# Patient Record
Sex: Female | Born: 1945
Health system: Southern US, Community
[De-identification: ages and names within clinical notes are randomized; demographics above are authoritative.]

## PROBLEM LIST (undated history)

## (undated) DIAGNOSIS — F411 Generalized anxiety disorder: Secondary | ICD-10-CM

## (undated) DIAGNOSIS — L300 Nummular dermatitis: Secondary | ICD-10-CM

## (undated) DIAGNOSIS — M810 Age-related osteoporosis without current pathological fracture: Secondary | ICD-10-CM

## (undated) DIAGNOSIS — E782 Mixed hyperlipidemia: Secondary | ICD-10-CM

## (undated) DIAGNOSIS — R002 Palpitations: Secondary | ICD-10-CM

## (undated) HISTORY — DX: Nummular dermatitis: L30.0

## (undated) HISTORY — DX: Mixed hyperlipidemia: E78.2

## (undated) HISTORY — PX: CATARACT EXTRACTION: SUR2

## (undated) HISTORY — DX: Age-related osteoporosis without current pathological fracture: M81.0

## (undated) HISTORY — DX: Palpitations: R00.2

## (undated) HISTORY — DX: Generalized anxiety disorder: F41.1

---

## 1999-10-16 ENCOUNTER — Encounter: Payer: Self-pay | Admitting: Obstetrics and Gynecology

## 1999-10-16 ENCOUNTER — Encounter: Admission: RE | Admit: 1999-10-16 | Discharge: 1999-10-16 | Payer: Self-pay | Admitting: Obstetrics and Gynecology

## 2000-10-20 ENCOUNTER — Encounter: Admission: RE | Admit: 2000-10-20 | Discharge: 2000-10-20 | Payer: Self-pay | Admitting: Obstetrics and Gynecology

## 2000-10-20 ENCOUNTER — Encounter: Payer: Self-pay | Admitting: Obstetrics and Gynecology

## 2001-10-21 ENCOUNTER — Encounter: Payer: Self-pay | Admitting: Obstetrics and Gynecology

## 2001-10-21 ENCOUNTER — Encounter: Admission: RE | Admit: 2001-10-21 | Discharge: 2001-10-21 | Payer: Self-pay | Admitting: Obstetrics and Gynecology

## 2002-10-23 ENCOUNTER — Encounter: Payer: Self-pay | Admitting: Obstetrics and Gynecology

## 2002-10-23 ENCOUNTER — Encounter: Admission: RE | Admit: 2002-10-23 | Discharge: 2002-10-23 | Payer: Self-pay | Admitting: Obstetrics and Gynecology

## 2003-11-07 ENCOUNTER — Encounter: Admission: RE | Admit: 2003-11-07 | Discharge: 2003-11-07 | Payer: Self-pay | Admitting: Obstetrics and Gynecology

## 2004-12-03 ENCOUNTER — Encounter: Admission: RE | Admit: 2004-12-03 | Discharge: 2004-12-03 | Payer: Self-pay | Admitting: Obstetrics and Gynecology

## 2005-12-07 ENCOUNTER — Encounter: Admission: RE | Admit: 2005-12-07 | Discharge: 2005-12-07 | Payer: Self-pay | Admitting: Obstetrics and Gynecology

## 2006-12-10 ENCOUNTER — Encounter: Admission: RE | Admit: 2006-12-10 | Discharge: 2006-12-10 | Payer: Self-pay | Admitting: Obstetrics and Gynecology

## 2007-12-15 ENCOUNTER — Encounter: Admission: RE | Admit: 2007-12-15 | Discharge: 2007-12-15 | Payer: Self-pay | Admitting: Obstetrics and Gynecology

## 2008-12-17 ENCOUNTER — Encounter: Admission: RE | Admit: 2008-12-17 | Discharge: 2008-12-17 | Payer: Self-pay | Admitting: Obstetrics and Gynecology

## 2009-12-18 ENCOUNTER — Encounter
Admission: RE | Admit: 2009-12-18 | Discharge: 2009-12-18 | Payer: Self-pay | Source: Home / Self Care | Attending: Obstetrics and Gynecology | Admitting: Obstetrics and Gynecology

## 2010-01-26 ENCOUNTER — Encounter: Payer: Self-pay | Admitting: Obstetrics and Gynecology

## 2010-11-17 ENCOUNTER — Other Ambulatory Visit: Payer: Self-pay | Admitting: Obstetrics and Gynecology

## 2010-11-17 DIAGNOSIS — Z1231 Encounter for screening mammogram for malignant neoplasm of breast: Secondary | ICD-10-CM

## 2010-12-22 ENCOUNTER — Ambulatory Visit
Admission: RE | Admit: 2010-12-22 | Discharge: 2010-12-22 | Disposition: A | Discharge status: Home / Self Care | Payer: Medicare Other | Source: Ambulatory Visit | Attending: Obstetrics and Gynecology | Admitting: Obstetrics and Gynecology

## 2010-12-22 DIAGNOSIS — Z1231 Encounter for screening mammogram for malignant neoplasm of breast: Secondary | ICD-10-CM

## 2011-11-19 ENCOUNTER — Other Ambulatory Visit: Payer: Self-pay | Admitting: Obstetrics and Gynecology

## 2011-11-19 DIAGNOSIS — Z1231 Encounter for screening mammogram for malignant neoplasm of breast: Secondary | ICD-10-CM

## 2011-12-23 ENCOUNTER — Ambulatory Visit
Admission: RE | Admit: 2011-12-23 | Discharge: 2011-12-23 | Disposition: A | Discharge status: Home / Self Care | Payer: Medicare Other | Source: Ambulatory Visit | Attending: Obstetrics and Gynecology | Admitting: Obstetrics and Gynecology

## 2011-12-23 DIAGNOSIS — Z1231 Encounter for screening mammogram for malignant neoplasm of breast: Secondary | ICD-10-CM

## 2012-11-21 ENCOUNTER — Other Ambulatory Visit: Payer: Self-pay

## 2012-11-21 DIAGNOSIS — Z1231 Encounter for screening mammogram for malignant neoplasm of breast: Secondary | ICD-10-CM

## 2012-12-23 ENCOUNTER — Ambulatory Visit
Admission: RE | Admit: 2012-12-23 | Discharge: 2012-12-23 | Disposition: A | Discharge status: Home / Self Care | Payer: Medicare Other | Source: Ambulatory Visit

## 2012-12-23 DIAGNOSIS — Z1231 Encounter for screening mammogram for malignant neoplasm of breast: Secondary | ICD-10-CM

## 2013-06-01 LAB — HM COLONOSCOPY

## 2013-11-21 ENCOUNTER — Other Ambulatory Visit: Payer: Self-pay

## 2013-11-21 DIAGNOSIS — Z1231 Encounter for screening mammogram for malignant neoplasm of breast: Secondary | ICD-10-CM

## 2013-11-29 DIAGNOSIS — F32A Depression, unspecified: Secondary | ICD-10-CM | POA: Insufficient documentation

## 2013-12-25 ENCOUNTER — Ambulatory Visit
Admission: RE | Admit: 2013-12-25 | Discharge: 2013-12-25 | Disposition: A | Discharge status: Home / Self Care | Payer: Commercial Managed Care - HMO | Source: Ambulatory Visit

## 2013-12-25 DIAGNOSIS — Z1231 Encounter for screening mammogram for malignant neoplasm of breast: Secondary | ICD-10-CM

## 2014-11-22 ENCOUNTER — Other Ambulatory Visit: Payer: Self-pay

## 2014-11-22 DIAGNOSIS — Z1231 Encounter for screening mammogram for malignant neoplasm of breast: Secondary | ICD-10-CM

## 2014-12-28 ENCOUNTER — Ambulatory Visit
Admission: RE | Admit: 2014-12-28 | Discharge: 2014-12-28 | Disposition: A | Discharge status: Home / Self Care | Payer: Commercial Managed Care - HMO | Source: Ambulatory Visit

## 2014-12-28 DIAGNOSIS — Z1231 Encounter for screening mammogram for malignant neoplasm of breast: Secondary | ICD-10-CM

## 2015-11-26 ENCOUNTER — Other Ambulatory Visit: Payer: Self-pay | Admitting: Family Medicine

## 2015-11-26 DIAGNOSIS — Z1231 Encounter for screening mammogram for malignant neoplasm of breast: Secondary | ICD-10-CM

## 2015-12-31 ENCOUNTER — Ambulatory Visit
Admission: RE | Admit: 2015-12-31 | Discharge: 2015-12-31 | Disposition: A | Discharge status: Home / Self Care | Payer: Commercial Managed Care - HMO | Source: Ambulatory Visit | Attending: Family Medicine | Admitting: Family Medicine

## 2015-12-31 DIAGNOSIS — Z1231 Encounter for screening mammogram for malignant neoplasm of breast: Secondary | ICD-10-CM

## 2016-01-09 DIAGNOSIS — H43393 Other vitreous opacities, bilateral: Secondary | ICD-10-CM | POA: Diagnosis not present

## 2016-01-14 DIAGNOSIS — Z6823 Body mass index (BMI) 23.0-23.9, adult: Secondary | ICD-10-CM | POA: Diagnosis not present

## 2016-01-14 DIAGNOSIS — Z23 Encounter for immunization: Secondary | ICD-10-CM | POA: Diagnosis not present

## 2016-01-14 DIAGNOSIS — R10815 Periumbilic abdominal tenderness: Secondary | ICD-10-CM | POA: Diagnosis not present

## 2016-01-14 DIAGNOSIS — Z0001 Encounter for general adult medical examination with abnormal findings: Secondary | ICD-10-CM | POA: Diagnosis not present

## 2016-03-23 DIAGNOSIS — R1084 Generalized abdominal pain: Secondary | ICD-10-CM | POA: Diagnosis not present

## 2016-03-23 DIAGNOSIS — N951 Menopausal and female climacteric states: Secondary | ICD-10-CM | POA: Diagnosis not present

## 2016-03-23 DIAGNOSIS — R69 Illness, unspecified: Secondary | ICD-10-CM | POA: Diagnosis not present

## 2016-03-23 DIAGNOSIS — E782 Mixed hyperlipidemia: Secondary | ICD-10-CM | POA: Diagnosis not present

## 2016-03-23 DIAGNOSIS — R002 Palpitations: Secondary | ICD-10-CM | POA: Diagnosis not present

## 2016-03-26 ENCOUNTER — Other Ambulatory Visit: Payer: Self-pay | Admitting: Family Medicine

## 2016-03-26 DIAGNOSIS — N959 Unspecified menopausal and perimenopausal disorder: Secondary | ICD-10-CM

## 2016-04-01 ENCOUNTER — Ambulatory Visit
Admission: RE | Admit: 2016-04-01 | Discharge: 2016-04-01 | Disposition: A | Discharge status: Home / Self Care | Payer: Medicare HMO | Source: Ambulatory Visit | Attending: Family Medicine | Admitting: Family Medicine

## 2016-04-01 DIAGNOSIS — N959 Unspecified menopausal and perimenopausal disorder: Secondary | ICD-10-CM

## 2016-04-01 DIAGNOSIS — Z780 Asymptomatic menopausal state: Secondary | ICD-10-CM | POA: Diagnosis not present

## 2016-04-01 DIAGNOSIS — M810 Age-related osteoporosis without current pathological fracture: Secondary | ICD-10-CM | POA: Diagnosis not present

## 2016-04-01 LAB — HM DEXA SCAN

## 2016-04-23 DIAGNOSIS — R140 Abdominal distension (gaseous): Secondary | ICD-10-CM | POA: Diagnosis not present

## 2016-04-23 DIAGNOSIS — Z86010 Personal history of colonic polyps: Secondary | ICD-10-CM | POA: Insufficient documentation

## 2016-04-23 DIAGNOSIS — R1030 Lower abdominal pain, unspecified: Secondary | ICD-10-CM | POA: Insufficient documentation

## 2016-04-29 DIAGNOSIS — R1032 Left lower quadrant pain: Secondary | ICD-10-CM | POA: Diagnosis not present

## 2016-04-29 DIAGNOSIS — R109 Unspecified abdominal pain: Secondary | ICD-10-CM | POA: Diagnosis not present

## 2016-06-25 DIAGNOSIS — E782 Mixed hyperlipidemia: Secondary | ICD-10-CM | POA: Diagnosis not present

## 2016-06-25 DIAGNOSIS — Z800 Family history of malignant neoplasm of digestive organs: Secondary | ICD-10-CM | POA: Diagnosis not present

## 2016-06-25 DIAGNOSIS — M810 Age-related osteoporosis without current pathological fracture: Secondary | ICD-10-CM | POA: Diagnosis not present

## 2016-08-18 DIAGNOSIS — Z0100 Encounter for examination of eyes and vision without abnormal findings: Secondary | ICD-10-CM | POA: Diagnosis not present

## 2016-08-27 DIAGNOSIS — H5203 Hypermetropia, bilateral: Secondary | ICD-10-CM | POA: Diagnosis not present

## 2016-10-05 DIAGNOSIS — Z23 Encounter for immunization: Secondary | ICD-10-CM | POA: Diagnosis not present

## 2016-10-05 DIAGNOSIS — E782 Mixed hyperlipidemia: Secondary | ICD-10-CM | POA: Diagnosis not present

## 2016-10-05 DIAGNOSIS — M810 Age-related osteoporosis without current pathological fracture: Secondary | ICD-10-CM | POA: Diagnosis not present

## 2016-11-24 ENCOUNTER — Other Ambulatory Visit: Payer: Self-pay | Admitting: Family Medicine

## 2016-11-24 DIAGNOSIS — Z139 Encounter for screening, unspecified: Secondary | ICD-10-CM

## 2017-01-01 ENCOUNTER — Ambulatory Visit
Admission: RE | Admit: 2017-01-01 | Discharge: 2017-01-01 | Disposition: A | Discharge status: Home / Self Care | Payer: Medicare HMO | Source: Ambulatory Visit | Attending: Family Medicine | Admitting: Family Medicine

## 2017-01-01 DIAGNOSIS — Z1231 Encounter for screening mammogram for malignant neoplasm of breast: Secondary | ICD-10-CM | POA: Diagnosis not present

## 2017-01-01 DIAGNOSIS — Z139 Encounter for screening, unspecified: Secondary | ICD-10-CM

## 2017-01-20 DIAGNOSIS — J028 Acute pharyngitis due to other specified organisms: Secondary | ICD-10-CM | POA: Diagnosis not present

## 2017-03-01 DIAGNOSIS — M810 Age-related osteoporosis without current pathological fracture: Secondary | ICD-10-CM | POA: Diagnosis not present

## 2017-03-01 DIAGNOSIS — E782 Mixed hyperlipidemia: Secondary | ICD-10-CM | POA: Diagnosis not present

## 2017-06-03 DIAGNOSIS — E782 Mixed hyperlipidemia: Secondary | ICD-10-CM | POA: Diagnosis not present

## 2017-06-03 DIAGNOSIS — M810 Age-related osteoporosis without current pathological fracture: Secondary | ICD-10-CM | POA: Diagnosis not present

## 2017-09-07 DIAGNOSIS — Z23 Encounter for immunization: Secondary | ICD-10-CM | POA: Diagnosis not present

## 2017-09-07 DIAGNOSIS — Z6823 Body mass index (BMI) 23.0-23.9, adult: Secondary | ICD-10-CM | POA: Diagnosis not present

## 2017-09-07 DIAGNOSIS — M810 Age-related osteoporosis without current pathological fracture: Secondary | ICD-10-CM | POA: Diagnosis not present

## 2017-09-07 DIAGNOSIS — E782 Mixed hyperlipidemia: Secondary | ICD-10-CM | POA: Diagnosis not present

## 2017-09-10 DIAGNOSIS — L300 Nummular dermatitis: Secondary | ICD-10-CM | POA: Diagnosis not present

## 2017-09-16 DIAGNOSIS — H25813 Combined forms of age-related cataract, bilateral: Secondary | ICD-10-CM | POA: Diagnosis not present

## 2017-10-04 DIAGNOSIS — H2512 Age-related nuclear cataract, left eye: Secondary | ICD-10-CM | POA: Diagnosis not present

## 2017-10-04 DIAGNOSIS — Z01818 Encounter for other preprocedural examination: Secondary | ICD-10-CM | POA: Diagnosis not present

## 2017-10-04 DIAGNOSIS — H25812 Combined forms of age-related cataract, left eye: Secondary | ICD-10-CM | POA: Diagnosis not present

## 2017-10-04 DIAGNOSIS — H1851 Endothelial corneal dystrophy: Secondary | ICD-10-CM | POA: Diagnosis not present

## 2017-10-26 DIAGNOSIS — Z961 Presence of intraocular lens: Secondary | ICD-10-CM | POA: Diagnosis not present

## 2017-11-08 DIAGNOSIS — H25811 Combined forms of age-related cataract, right eye: Secondary | ICD-10-CM | POA: Diagnosis not present

## 2017-11-08 DIAGNOSIS — H1851 Endothelial corneal dystrophy: Secondary | ICD-10-CM | POA: Diagnosis not present

## 2017-11-08 DIAGNOSIS — H2511 Age-related nuclear cataract, right eye: Secondary | ICD-10-CM | POA: Diagnosis not present

## 2017-11-22 ENCOUNTER — Other Ambulatory Visit: Payer: Self-pay | Admitting: Family Medicine

## 2017-11-22 DIAGNOSIS — Z1231 Encounter for screening mammogram for malignant neoplasm of breast: Secondary | ICD-10-CM

## 2017-12-07 DIAGNOSIS — Z961 Presence of intraocular lens: Secondary | ICD-10-CM | POA: Diagnosis not present

## 2017-12-09 DIAGNOSIS — E782 Mixed hyperlipidemia: Secondary | ICD-10-CM | POA: Diagnosis not present

## 2017-12-09 DIAGNOSIS — Z6823 Body mass index (BMI) 23.0-23.9, adult: Secondary | ICD-10-CM | POA: Diagnosis not present

## 2017-12-09 DIAGNOSIS — Z0000 Encounter for general adult medical examination without abnormal findings: Secondary | ICD-10-CM | POA: Diagnosis not present

## 2018-01-04 ENCOUNTER — Ambulatory Visit
Admission: RE | Admit: 2018-01-04 | Discharge: 2018-01-04 | Disposition: A | Discharge status: Home / Self Care | Payer: Medicare HMO | Source: Ambulatory Visit | Attending: Family Medicine | Admitting: Family Medicine

## 2018-01-04 DIAGNOSIS — Z1231 Encounter for screening mammogram for malignant neoplasm of breast: Secondary | ICD-10-CM

## 2018-01-06 ENCOUNTER — Other Ambulatory Visit: Payer: Self-pay | Admitting: Family Medicine

## 2018-01-06 DIAGNOSIS — R928 Other abnormal and inconclusive findings on diagnostic imaging of breast: Secondary | ICD-10-CM

## 2018-01-10 ENCOUNTER — Other Ambulatory Visit: Payer: Medicare HMO

## 2018-01-12 ENCOUNTER — Ambulatory Visit
Admission: RE | Admit: 2018-01-12 | Discharge: 2018-01-12 | Disposition: A | Discharge status: Home / Self Care | Payer: Medicare HMO | Source: Ambulatory Visit | Attending: Family Medicine | Admitting: Family Medicine

## 2018-01-12 ENCOUNTER — Other Ambulatory Visit: Payer: Self-pay | Admitting: Family Medicine

## 2018-01-12 DIAGNOSIS — R928 Other abnormal and inconclusive findings on diagnostic imaging of breast: Secondary | ICD-10-CM

## 2018-01-12 DIAGNOSIS — N6001 Solitary cyst of right breast: Secondary | ICD-10-CM | POA: Diagnosis not present

## 2018-01-12 DIAGNOSIS — R922 Inconclusive mammogram: Secondary | ICD-10-CM | POA: Diagnosis not present

## 2018-01-12 DIAGNOSIS — N6489 Other specified disorders of breast: Secondary | ICD-10-CM | POA: Diagnosis not present

## 2018-01-12 DIAGNOSIS — N6310 Unspecified lump in the right breast, unspecified quadrant: Secondary | ICD-10-CM

## 2018-02-17 DIAGNOSIS — J018 Other acute sinusitis: Secondary | ICD-10-CM | POA: Diagnosis not present

## 2018-03-09 DIAGNOSIS — K130 Diseases of lips: Secondary | ICD-10-CM | POA: Diagnosis not present

## 2018-03-17 DIAGNOSIS — E782 Mixed hyperlipidemia: Secondary | ICD-10-CM | POA: Diagnosis not present

## 2018-03-17 DIAGNOSIS — M810 Age-related osteoporosis without current pathological fracture: Secondary | ICD-10-CM | POA: Diagnosis not present

## 2018-03-17 DIAGNOSIS — K1239 Other oral mucositis (ulcerative): Secondary | ICD-10-CM | POA: Diagnosis not present

## 2018-03-17 DIAGNOSIS — Z6823 Body mass index (BMI) 23.0-23.9, adult: Secondary | ICD-10-CM | POA: Diagnosis not present

## 2018-03-22 ENCOUNTER — Other Ambulatory Visit: Payer: Self-pay | Admitting: Family Medicine

## 2018-03-22 DIAGNOSIS — M810 Age-related osteoporosis without current pathological fracture: Secondary | ICD-10-CM

## 2018-06-08 DIAGNOSIS — Z800 Family history of malignant neoplasm of digestive organs: Secondary | ICD-10-CM | POA: Diagnosis not present

## 2018-06-08 DIAGNOSIS — K648 Other hemorrhoids: Secondary | ICD-10-CM | POA: Diagnosis not present

## 2018-06-08 DIAGNOSIS — Z86010 Personal history of colonic polyps: Secondary | ICD-10-CM | POA: Diagnosis not present

## 2018-06-08 DIAGNOSIS — D124 Benign neoplasm of descending colon: Secondary | ICD-10-CM | POA: Diagnosis not present

## 2018-06-08 DIAGNOSIS — K5730 Diverticulosis of large intestine without perforation or abscess without bleeding: Secondary | ICD-10-CM | POA: Diagnosis not present

## 2018-06-08 DIAGNOSIS — K635 Polyp of colon: Secondary | ICD-10-CM | POA: Diagnosis not present

## 2018-06-08 DIAGNOSIS — Z1211 Encounter for screening for malignant neoplasm of colon: Secondary | ICD-10-CM | POA: Diagnosis not present

## 2018-06-08 DIAGNOSIS — D120 Benign neoplasm of cecum: Secondary | ICD-10-CM | POA: Diagnosis not present

## 2018-06-08 LAB — HM COLONOSCOPY

## 2018-06-21 DIAGNOSIS — Z6823 Body mass index (BMI) 23.0-23.9, adult: Secondary | ICD-10-CM | POA: Diagnosis not present

## 2018-06-21 DIAGNOSIS — E782 Mixed hyperlipidemia: Secondary | ICD-10-CM | POA: Diagnosis not present

## 2018-06-21 DIAGNOSIS — Z1159 Encounter for screening for other viral diseases: Secondary | ICD-10-CM | POA: Diagnosis not present

## 2018-07-15 ENCOUNTER — Ambulatory Visit
Admission: RE | Admit: 2018-07-15 | Discharge: 2018-07-15 | Disposition: A | Discharge status: Home / Self Care | Payer: Medicare HMO | Source: Ambulatory Visit | Attending: Family Medicine | Admitting: Family Medicine

## 2018-07-15 ENCOUNTER — Other Ambulatory Visit: Payer: Self-pay | Admitting: Family Medicine

## 2018-07-15 ENCOUNTER — Other Ambulatory Visit: Payer: Self-pay

## 2018-07-15 DIAGNOSIS — R922 Inconclusive mammogram: Secondary | ICD-10-CM | POA: Diagnosis not present

## 2018-07-15 DIAGNOSIS — N6313 Unspecified lump in the right breast, lower outer quadrant: Secondary | ICD-10-CM | POA: Diagnosis not present

## 2018-07-15 DIAGNOSIS — N6310 Unspecified lump in the right breast, unspecified quadrant: Secondary | ICD-10-CM

## 2018-07-15 DIAGNOSIS — Z780 Asymptomatic menopausal state: Secondary | ICD-10-CM | POA: Diagnosis not present

## 2018-07-15 DIAGNOSIS — M810 Age-related osteoporosis without current pathological fracture: Secondary | ICD-10-CM | POA: Diagnosis not present

## 2018-07-15 DIAGNOSIS — N6314 Unspecified lump in the right breast, lower inner quadrant: Secondary | ICD-10-CM | POA: Diagnosis not present

## 2018-09-23 DIAGNOSIS — M810 Age-related osteoporosis without current pathological fracture: Secondary | ICD-10-CM | POA: Diagnosis not present

## 2018-09-23 DIAGNOSIS — Z6823 Body mass index (BMI) 23.0-23.9, adult: Secondary | ICD-10-CM | POA: Diagnosis not present

## 2018-09-23 DIAGNOSIS — E782 Mixed hyperlipidemia: Secondary | ICD-10-CM | POA: Diagnosis not present

## 2018-09-23 DIAGNOSIS — Z23 Encounter for immunization: Secondary | ICD-10-CM | POA: Diagnosis not present

## 2018-12-14 DIAGNOSIS — Z0000 Encounter for general adult medical examination without abnormal findings: Secondary | ICD-10-CM | POA: Diagnosis not present

## 2018-12-14 DIAGNOSIS — Z6823 Body mass index (BMI) 23.0-23.9, adult: Secondary | ICD-10-CM | POA: Diagnosis not present

## 2018-12-14 DIAGNOSIS — E782 Mixed hyperlipidemia: Secondary | ICD-10-CM | POA: Diagnosis not present

## 2019-01-20 ENCOUNTER — Other Ambulatory Visit: Payer: Medicare HMO

## 2019-01-24 ENCOUNTER — Other Ambulatory Visit: Payer: Self-pay

## 2019-01-24 ENCOUNTER — Ambulatory Visit
Admission: RE | Admit: 2019-01-24 | Discharge: 2019-01-24 | Disposition: A | Discharge status: Home / Self Care | Payer: Medicare HMO | Source: Ambulatory Visit | Attending: Family Medicine | Admitting: Family Medicine

## 2019-01-24 ENCOUNTER — Other Ambulatory Visit: Payer: Self-pay | Admitting: Family Medicine

## 2019-01-24 DIAGNOSIS — N6314 Unspecified lump in the right breast, lower inner quadrant: Secondary | ICD-10-CM | POA: Diagnosis not present

## 2019-01-24 DIAGNOSIS — R921 Mammographic calcification found on diagnostic imaging of breast: Secondary | ICD-10-CM

## 2019-01-24 DIAGNOSIS — N6313 Unspecified lump in the right breast, lower outer quadrant: Secondary | ICD-10-CM | POA: Diagnosis not present

## 2019-01-24 DIAGNOSIS — N6310 Unspecified lump in the right breast, unspecified quadrant: Secondary | ICD-10-CM

## 2019-01-24 DIAGNOSIS — R922 Inconclusive mammogram: Secondary | ICD-10-CM | POA: Diagnosis not present

## 2019-02-01 ENCOUNTER — Other Ambulatory Visit: Payer: Self-pay

## 2019-02-01 ENCOUNTER — Ambulatory Visit
Admission: RE | Admit: 2019-02-01 | Discharge: 2019-02-01 | Disposition: A | Discharge status: Home / Self Care | Payer: Medicare HMO | Source: Ambulatory Visit | Attending: Family Medicine | Admitting: Family Medicine

## 2019-02-01 DIAGNOSIS — N6489 Other specified disorders of breast: Secondary | ICD-10-CM | POA: Diagnosis not present

## 2019-02-01 DIAGNOSIS — R921 Mammographic calcification found on diagnostic imaging of breast: Secondary | ICD-10-CM

## 2019-02-01 DIAGNOSIS — N6311 Unspecified lump in the right breast, upper outer quadrant: Secondary | ICD-10-CM | POA: Diagnosis not present

## 2019-03-15 ENCOUNTER — Ambulatory Visit (INDEPENDENT_AMBULATORY_CARE_PROVIDER_SITE_OTHER): Payer: Medicare HMO | Admitting: Family Medicine

## 2019-03-15 ENCOUNTER — Other Ambulatory Visit: Payer: Self-pay

## 2019-03-15 ENCOUNTER — Encounter: Payer: Self-pay | Admitting: Family Medicine

## 2019-03-15 VITALS — BP 124/68 | HR 74 | Temp 96.4°F | Ht 60.0 in | Wt 116.0 lb

## 2019-03-15 DIAGNOSIS — M7541 Impingement syndrome of right shoulder: Secondary | ICD-10-CM | POA: Diagnosis not present

## 2019-03-15 DIAGNOSIS — M79605 Pain in left leg: Secondary | ICD-10-CM | POA: Diagnosis not present

## 2019-03-15 MED ORDER — MELOXICAM 15 MG PO TABS: 15.0000 mg | ORAL_TABLET | Freq: Every day | ORAL | 0 refills | Status: DC

## 2019-03-15 NOTE — Progress Notes (Signed)
Established Patient Office Visit  Subjective:  Patient ID: Kimberly Estrada, female    DOB: [birth year]  Age: 74 y.o. MRN: [id number]  CC:  Chief Complaint  Patient presents with  . Shoulder Pain    Decreased ROM has been applying HEMP and [place]    HPI Kimberly Estrada presents for left lower leg and right shoulder pain. Leg pain for 1-2 months and is worse with ambulation. Right shoulder pain worsened when she was reaching into her back seat and had sudden pain in her shoulder.  She is unable to walk daily as she used to do because of pain in her left lower leg.   Past Medical History:  Diagnosis Date  . GAD (generalized anxiety disorder)   . Mixed hyperlipidemia   . Nummular dermatitis   . Osteoporosis   . Palpitations     Past Surgical History:  Procedure Laterality Date  . CATARACT EXTRACTION Bilateral     Family History  Problem Relation Age of Onset  . Breast cancer Mother 64  . CAD Father   . Cancer Sister        colon    Social History   Socioeconomic History  . Marital status: Married    Spouse name: Lettye Bohls  . Number of children: 3  . Years of education: Not on file  . Highest education level: Not on file  Occupational History  . Not on file  Tobacco Use  . Smoking status: Never Smoker  . Smokeless tobacco: Never Used  Substance and Sexual Activity  . Alcohol use: Never  . Drug use: Never  . Sexual activity: Not on file  Other Topics Concern  . Not on file  Social History Narrative  . Not on file   Social Determinants of Health   Financial Resource Strain:   . Difficulty of Paying Living Expenses: Not on file  Food Insecurity:   . Worried About Charity fundraiser in the Last Year: Not on file  . Ran Out of Food in the Last Year: Not on file  Transportation Needs:   . Lack of Transportation (Medical): Not on file  . Lack of Transportation (Non-Medical): Not on file  Physical Activity:   . Days of Exercise per Week: Not on  file  . Minutes of Exercise per Session: Not on file  Stress:   . Feeling of Stress : Not on file  Social Connections:   . Frequency of Communication with Friends and Family: Not on file  . Frequency of Social Gatherings with Friends and Family: Not on file  . Attends Religious Services: Not on file  . Active Member of Clubs or Organizations: Not on file  . Attends Archivist Meetings: Not on file  . Marital Status: Not on file  Intimate Partner Violence:   . Fear of Current or Ex-Partner: Not on file  . Emotionally Abused: Not on file  . Physically Abused: Not on file  . Sexually Abused: Not on file    Outpatient Medications Prior to Visit  Medication Sig Dispense Refill  . co-enzyme Q-10 30 MG capsule Take by mouth.    . Omega-3 1000 MG CAPS Take by mouth.    . pravastatin (PRAVACHOL) 80 MG tablet Take 40 mg by mouth at bedtime.     No facility-administered medications prior to visit.    Not on File  ROS Review of Systems  Constitutional: Negative for chills and fever.  HENT: Negative for  ear pain, sneezing and sore throat.   Respiratory: Negative for cough and shortness of breath.   Cardiovascular: Negative for chest pain.  Gastrointestinal: Negative for abdominal pain, constipation, diarrhea, nausea and vomiting.  Genitourinary: Negative for dysuria.  Musculoskeletal: Negative for arthralgias, joint swelling and myalgias.       Right shoulder pain decreased ROM  Psychiatric/Behavioral: Negative for dysphoric mood. The patient is not nervous/anxious.       Objective:    Physical Exam  Constitutional: She appears well-developed and well-nourished.  Cardiovascular: Normal rate, regular rhythm and intact distal pulses.  Pulses may be slightly decreased on left foot over right foot.   Pulmonary/Chest: Effort normal and breath sounds normal. No respiratory distress.  Abdominal: Soft. Bowel sounds are normal. There is no abdominal tenderness.   Musculoskeletal:        General: Tenderness present.     Comments: With external/internal rotation and abduction.  Left posterior upper calf and knee tender. Full rom of knee. No edema. No varicosities.  Neurological: She is alert.    BP 124/68 (BP Location: Right Arm, Patient Position: Sitting)   Pulse 74   Temp (!) 96.4 F (35.8 C) (Temporal)   Ht 5' (1.524 m)   Wt 116 lb (52.6 kg)   SpO2 98%   BMI 22.65 kg/m  Wt Readings from Last 3 Encounters:  03/15/19 116 lb (52.6 kg)     Health Maintenance Due  Topic Date Due  . Hepatitis C Screening  Jan 17, 1945  . TETANUS/TDAP  10/02/1964  . COLONOSCOPY  10/03/1995  . PNA vac Low Risk Adult (1 of 2 - PCV13) 10/03/2010  . INFLUENZA VACCINE  08/06/2018    Assessment & Plan:  1. Rotator cuff impingement syndrome of right shoulder Meloxicam 15 mg once daily. If no improvement, recommend xray and consider steroid injection. Patient does not wish to proceed with injection at this time.  Exercises/education given.   2. Left leg pain History is inconsistent with PVD. Education given. If pain is not improving with meloxicam, will proceed with ABI.  Follow-up: No follow-ups on file.    Arsenio Katz, CMA

## 2019-03-15 NOTE — Assessment & Plan Note (Signed)
Muscle strain versus claudication. Patient wishes to hold of on ABI. I discussed my concerns and if she is no better we weill proceed with ABI at her last visti.

## 2019-03-15 NOTE — Assessment & Plan Note (Signed)
Meloxicam 15 mg once daily.  Exercises given.

## 2019-03-15 NOTE — Patient Instructions (Signed)
Start meloxicam 15 mg once daily.   Peripheral Vascular Disease  Peripheral vascular disease (PVD) is a disease of the blood vessels that are not part of your heart and brain. A simple term for PVD is poor circulation. In most cases, PVD narrows the blood vessels that carry blood from your heart to the rest of your body. This can reduce the supply of blood to your arms, legs, and internal organs, like your stomach or kidneys. However, PVD most often affects a person's lower legs and feet. Without treatment, PVD tends to get worse. PVD can also lead to acute ischemic limb. This is when an arm or leg suddenly cannot get enough blood. This is a medical emergency. Follow these instructions at home: Lifestyle  Do not use any products that contain nicotine or tobacco, such as cigarettes and e-cigarettes. If you need help quitting, ask your doctor.  Lose weight if you are overweight. Or, stay at a healthy weight as told by your doctor.  Eat a diet that is low in fat and cholesterol. If you need help, ask your doctor.  Exercise regularly. Ask your doctor for activities that are right for you. General instructions  Take over-the-counter and prescription medicines only as told by your doctor.  Take good care of your feet: ? Wear comfortable shoes that fit well. ? Check your feet often for any cuts or sores.  Keep all follow-up visits as told by your doctor This is important. Contact a doctor if:  You have cramps in your legs when you walk.  You have leg pain when you are at rest.  You have coldness in a leg or foot.  Your skin changes.  You are unable to get or have an erection (erectile dysfunction).  You have cuts or sores on your feet that do not heal. Get help right away if:  Your arm or leg turns cold, numb, and blue.  Your arms or legs become red, warm, swollen, painful, or numb.  You have chest pain.  You have trouble breathing.  You suddenly have weakness in your face,  arm, or leg.  You become very confused or you cannot speak.  You suddenly have a very bad headache.  You suddenly cannot see. Summary  Peripheral vascular disease (PVD) is a disease of the blood vessels.  A simple term for PVD is poor circulation. Without treatment, PVD tends to get worse.  Treatment may include exercise, low fat and low cholesterol diet, and quitting smoking. This information is not intended to replace advice given to you by your health care provider. Make sure you discuss any questions you have with your health care provider. Document Revised: 12/04/2016 Document Reviewed: 01/30/2016 Elsevier Patient Education  [place] Cuff Tear  A rotator cuff tear is a partial or complete tear of the cord-like bands (tendons) that connect muscle to bone in the rotator cuff. The rotator cuff is a group of muscles and tendons that surround the shoulder joint and keep the upper arm bone (humerus) in the shoulder socket. The tear can occur suddenly (acute tear) or can develop over a long period of time (chronic tear). What are the causes? Acute tears may be caused by:  A fall, especially on an outstretched arm.  Lifting very heavy objects with a jerking motion. Chronic tears may be caused by overuse of the muscles. This may happen in sports, physical work, or activities in which your arm repeatedly moves over your head. What increases the  risk? This condition is more likely to occur in:  Athletes and workers who frequently use their shoulder or reach over their heads. This may include activities such as: ? Tennis. ? Baseball and softball. ? Swimming and rowing. ? Weightlifting. ? Architect work. ? Painting.  People who smoke.  Older people who have arthritis or poor blood supply. These can make the muscles and tendons weaker. What are the signs or symptoms? Symptoms of this condition depend on the type and severity of the injury:  An acute tear may  include a sudden tearing feeling, followed by severe pain that goes from your upper shoulder, down your arm, and toward your elbow.  A chronic tear includes a gradual weakness and decreased shoulder motion as the pain gets worse. The pain is usually worse at night. Both types may have symptoms such as:  Pain that spreads (radiates) from the shoulder to the upper arm.  Swelling and tenderness in front of the shoulder.  Decreased range of motion.  Pain when: ? Reaching, pulling, or lifting the arm above the head. ? Lowering the arm from above the head.  Not being able to raise your arm out to the side.  Difficulty placing the arm behind your back. How is this diagnosed? This condition is diagnosed with a medical history and physical exam. Imaging tests may also be done, including:  X-rays.  MRI.  Ultrasound.  CT or MR arthrogram. During this test, a contrast material is injected into your shoulder and then images are taken. How is this treated? Treatment for this condition depends on the type and severity of the condition. In less severe cases, treatment may include:  Rest. This may be done with a sling that holds the shoulder still (immobilization). Your health care provider may also recommend avoiding activities that involve lifting your arm over your head.  Icing the shoulder.  Anti-inflammatory medicines, such as aspirin or ibuprofen.  Strengthening and stretching exercises. Your health care provider may recommend specific exercises to improve your range of motion and strengthen your shoulder. In more severe cases, treatment may include:  Physical therapy.  Steroid injections.  Surgery. Follow these instructions at home: Managing pain, stiffness, and swelling  If directed, put ice on the injured area. ? If you have a removable sling, remove it as told by your health care provider. ? Put ice in a plastic bag. ? Place a towel between your skin and the bag. ? Leave  the ice on for 20 minutes, 2-3 times a day.  Raise (elevate) the injured area above the level of your heart while you are lying down.  Find a comfortable sleeping position or sleep on a recliner, if available.  Move your fingers often to avoid stiffness and to lessen swelling.  Once the swelling has gone down, your health care provider may direct you to apply heat to relax the muscles. Use the heat source that your health care provider recommends, such as a moist heat pack or a heating pad. ? Place a towel between your skin and the heat source. ? Leave the heat on for 20-30 minutes. ? Remove the heat if your skin turns bright red. This is especially important if you are unable to feel pain, heat, or cold. You may have a greater risk of getting burned. If you have a sling:  Wear the sling as told by your health care provider. Remove it only as told by your health care provider.  Loosen the sling if your  fingers tingle, become numb, or turn cold and blue.  Keep the sling clean.  If the sling is not waterproof: ? Do not let it get wet. ? Cover it with a watertight covering when you take a bath or a shower. Driving  Do not drive or use heavy machinery while taking prescription pain medicine.  Ask your health care provider when it is safe to drive if you have a sling on your arm. Activity  Rest your shoulder as told by your health care provider.  Return to your normal activities as told by your health care provider. Ask your health care provider what activities are safe for you.  Do any exercises or stretches as told by your health care provider. General instructions  Do not use any products that contain nicotine or tobacco, such as cigarettes and e-cigarettes. If you need help quitting, ask your health care provider.  Take over-the-counter and prescription medicines only as told by your health care provider.  Keep all follow-up visits as told by your health care provider. This is  important. Contact a health care provider if:  Your pain gets worse.  You have new pain in your arm, hands, or fingers.  Medicine does not help your pain. Get help right away if:  Your arm, hand, or fingers are numb or tingling.  Your arm, hand, or fingers are swollen or painful or they turn white or blue.  Your hand or fingers on your injured arm are colder than your other hand. Summary  A rotator cuff tear is a partial or complete tear of the cord-like bands (tendons) that connect muscle to bone in the rotator cuff.  The tear can occur suddenly (acute tear) or can develop over a long period of time (chronic tear).  Treatment generally includes rest, anti-inflammatory medicines, and icing. In some cases, physical therapy and steroid injections may be needed. In severe cases, surgery may be needed. This information is not intended to replace advice given to you by your health care provider. Make sure you discuss any questions you have with your health care provider. Document Revised: 12/04/2016 Document Reviewed: 03/09/2016 Elsevier Patient Education  [place].   Secondary Shoulder Impingement Syndrome Rehab Ask your health care provider which exercises are safe for you. Do exercises exactly as told by your health care provider and adjust them as directed. It is normal to feel mild stretching, pulling, tightness, or discomfort as you do these exercises. Stop right away if you feel sudden pain or your pain gets worse. Do not begin these exercises until told by your health care provider. Stretching and range-of-motion exercise This exercise warms up your muscles and joints and improves the movement and flexibility of your neck and shoulder. This exercise also helps to relieve pain and stiffness. Cervical side bend  1. Using good posture, sit on a stable chair, or stand up. 2. Without moving your shoulders, slowly tilt your left / right ear toward your left / right shoulder  until you feel a stretch in your neck (cervical) muscles on the other side. You should be looking straight ahead. 3. Hold for __________ seconds. 4. Slowly return to the starting position. 5. Repeat the stretch on your left / right side. Repeat __________ times. Complete this exercise __________ times a day. Strengthening exercises These exercises build strength and endurance in your shoulder. Endurance is the ability to use your muscles for a long time, even after they get tired. Scapular protraction, supine  1. Shanda Howells  on your back on a firm surface (supine position). Hold a __________ weight in your left / right hand. 2. Raise your left / right arm straight into the air so your hand is directly above your shoulder joint. 3. Push the weight into the air so your shoulder (scapula) lifts off the surface that you are lying on. The scapula will push up or forward (protraction). Do not move your head, neck, or back. 4. Hold for __________ seconds. 5. Slowly return to the starting position. Let your muscles relax completely before you repeat this exercise. Repeat __________ times. Complete this exercise __________ times a day. Scapular retraction  1. Sit in a stable chair without armrests, or stand up. 2. Secure an exercise band to a stable object in front of you so the band is at shoulder height. 3. Hold one end of the exercise band in each hand. Your palms should face down. 4. Squeeze your shoulder blades (scapulae) together and move your elbows slightly behind you (retraction). Do not shrug your shoulders upward while you do this. 5. Hold for __________ seconds. 6. Slowly return to the starting position. Repeat __________ times. Complete this exercise __________ times a day. Shoulder extension with scapular retraction  1. Sit in a stable chair without armrests, or stand up. 2. Secure an exercise band to a stable object in front of you so the band is above shoulder height. 3. Hold one end of  the exercise band in each hand. 4. Straighten your elbows and lift your hands up to shoulder height. 5. Squeeze your shoulder blades together (scapular retraction) and pull your hands down to the sides of your thighs (shoulder extension). Stop when your hands are straight down by your sides. Do not let your hands go behind your body. 6. Hold for __________ seconds. 7. Slowly return to the starting position. Repeat __________ times. Complete this exercise __________ times a day. Shoulder abduction 1. Sit in a stable chair without armrests, or stand up. 2. If directed, hold a __________ weight in your left / right hand. 3. Start with your arms straight down. Turn your left / right hand so your palm faces in, toward your body. 4. Slowly lift your left / right hand out to your side (abduction). Do not lift your hand above shoulder height. ? Keep your arms straight. ? Avoid shrugging your shoulder while you do this movement. Keep your shoulder blade tucked down toward the middle of your back. 5. Hold for __________ seconds. 6. Slowly lower your arm, and return to the starting position. Repeat __________ times. Complete this exercise __________ times a day. This information is not intended to replace advice given to you by your health care provider. Make sure you discuss any questions you have with your health care provider. Document Revised: 04/15/2018 Document Reviewed: 01/27/2018 Elsevier Patient Education  [place].

## 2019-03-23 ENCOUNTER — Encounter: Payer: Self-pay | Admitting: Family Medicine

## 2019-04-07 ENCOUNTER — Other Ambulatory Visit: Payer: Self-pay | Admitting: Family Medicine

## 2019-04-12 ENCOUNTER — Ambulatory Visit: Payer: Medicare HMO | Admitting: Family Medicine

## 2019-09-20 ENCOUNTER — Ambulatory Visit (INDEPENDENT_AMBULATORY_CARE_PROVIDER_SITE_OTHER): Payer: Medicare HMO | Admitting: Legal Medicine

## 2019-09-20 ENCOUNTER — Encounter: Payer: Self-pay | Admitting: Legal Medicine

## 2019-09-20 ENCOUNTER — Other Ambulatory Visit: Payer: Self-pay

## 2019-09-20 VITALS — BP 118/88 | HR 83 | Temp 97.8°F | Resp 16 | Ht 59.65 in | Wt 117.0 lb

## 2019-09-20 DIAGNOSIS — L247 Irritant contact dermatitis due to plants, except food: Secondary | ICD-10-CM

## 2019-09-20 DIAGNOSIS — T7840XA Allergy, unspecified, initial encounter: Secondary | ICD-10-CM

## 2019-09-20 MED ORDER — TRIAMCINOLONE ACETONIDE 40 MG/ML IJ SUSP
60.0000 mg | Freq: Once | INTRAMUSCULAR | Status: AC
  Administered 2019-09-20: 60 mg via INTRAMUSCULAR

## 2019-09-20 MED ORDER — TRIAMCINOLONE ACETONIDE 0.1 % EX CREA: 1.0000 "application " | TOPICAL_CREAM | Freq: Two times a day (BID) | CUTANEOUS | 2 refills | Status: DC

## 2019-09-20 NOTE — Progress Notes (Signed)
Acute Office Visit  Subjective:    Patient ID: Kimberly Estrada, female    DOB: 1945-09-12, 74 y.o.   MRN: [id number]  Chief Complaint  Patient presents with  . Rash    Since 5 days ago on left arm  . Anal Itching    HPI Patient is in today for rash left antecubital space.she was bit by insect in garden. It is red and confluent.  Few small papules.  Past Medical History:  Diagnosis Date  . GAD (generalized anxiety disorder)   . Mixed hyperlipidemia   . Nummular dermatitis   . Osteoporosis   . Palpitations     Past Surgical History:  Procedure Laterality Date  . CATARACT EXTRACTION Bilateral     Family History  Problem Relation Age of Onset  . Breast cancer Mother 23  . CAD Father   . Cancer Sister        colon    Social History   Socioeconomic History  . Marital status: Married    Spouse name: Berdina Cheever  . Number of children: 3  . Years of education: Not on file  . Highest education level: Not on file  Occupational History  . Not on file  Tobacco Use  . Smoking status: Never Smoker  . Smokeless tobacco: Never Used  Substance and Sexual Activity  . Alcohol use: Never  . Drug use: Never  . Sexual activity: Yes    Partners: Female  Other Topics Concern  . Not on file  Social History Narrative  . Not on file   Social Determinants of Health   Financial Resource Strain:   . Difficulty of Paying Living Expenses: Not on file  Food Insecurity:   . Worried About Charity fundraiser in the Last Year: Not on file  . Ran Out of Food in the Last Year: Not on file  Transportation Needs:   . Lack of Transportation (Medical): Not on file  . Lack of Transportation (Non-Medical): Not on file  Physical Activity:   . Days of Exercise per Week: Not on file  . Minutes of Exercise per Session: Not on file  Stress:   . Feeling of Stress : Not on file  Social Connections:   . Frequency of Communication with Friends and Family: Not on file  . Frequency of  Social Gatherings with Friends and Family: Not on file  . Attends Religious Services: Not on file  . Active Member of Clubs or Organizations: Not on file  . Attends Archivist Meetings: Not on file  . Marital Status: Not on file  Intimate Partner Violence:   . Fear of Current or Ex-Partner: Not on file  . Emotionally Abused: Not on file  . Physically Abused: Not on file  . Sexually Abused: Not on file    Outpatient Medications Prior to Visit  Medication Sig Dispense Refill  . co-enzyme Q-10 30 MG capsule Take by mouth.    . meloxicam (MOBIC) 15 MG tablet TAKE 1 TABLET BY MOUTH EVERY DAY 90 tablet 1  . Omega-3 1000 MG CAPS Take by mouth.    . pravastatin (PRAVACHOL) 80 MG tablet Take 40 mg by mouth at bedtime.     No facility-administered medications prior to visit.    No Known Allergies  Review of Systems  Constitutional: Negative.   HENT: Negative.   Eyes: Negative.   Respiratory: Negative.  Negative for cough and shortness of breath.   Cardiovascular: Negative.  Negative for  chest pain and leg swelling.  Gastrointestinal: Negative.   Genitourinary: Negative.   Musculoskeletal: Negative.   Skin: Positive for rash.  Neurological: Negative.   Psychiatric/Behavioral: Negative.        Objective:    Physical Exam Vitals reviewed.  Constitutional:      Appearance: Normal appearance.  HENT:     Head: Normocephalic and atraumatic.  Cardiovascular:     Rate and Rhythm: Normal rate and regular rhythm.     Pulses: Normal pulses.     Heart sounds: Normal heart sounds.  Pulmonary:     Effort: Pulmonary effort is normal.     Breath sounds: Normal breath sounds.  Musculoskeletal:        General: Normal range of motion.  Skin:    Capillary Refill: Capillary refill takes less than 2 seconds.     Comments: Confluent rash left antecubital area  Neurological:     General: No focal deficit present.     Mental Status: She is alert. Mental status is at baseline.      BP 118/88 (BP Location: Right Arm, Patient Position: Sitting)   Pulse 83   Temp 97.8 F (36.6 C) (Temporal)   Resp 16   Ht 4' 11.65" (1.515 m)   Wt 117 lb (53.1 kg)   SpO2 97%   BMI 23.12 kg/m  Wt Readings from Last 3 Encounters:  09/20/19 117 lb (53.1 kg)  03/15/19 116 lb (52.6 kg)    Health Maintenance Due  Topic Date Due  . Hepatitis C Screening  Never done  . COVID-19 Vaccine (1) Never done  . TETANUS/TDAP  Never done  . INFLUENZA VACCINE  08/06/2019    There are no preventive care reminders to display for this patient.   No results found for: TSH No results found for: WBC, HGB, HCT, MCV, PLT No results found for: NA, K, CHLORIDE, CO2, GLUCOSE, BUN, CREATININE, BILITOT, ALKPHOS, AST, ALT, PROT, ALBUMIN, CALCIUM, ANIONGAP, EGFR, GFR No results found for: CHOL No results found for: HDL No results found for: LDLCALC No results found for: TRIG No results found for: CHOLHDL No results found for: HGBA1C     Assessment & Plan:  1. Irritant contact dermatitis due to plants, except food - triamcinolone cream (KENALOG) 0.1 %; Apply 1 application topically 2 (two) times daily.  Dispense: 30 g; Refill: 2 - triamcinolone acetonide (KENALOG-40) injection 60 mg Patient has small bug bit in antecubital area left,  2. Allergic reaction, initial encounter The area of bite has expanded with erythema in antecubital area or arm    Meds ordered this encounter  Medications  . triamcinolone cream (KENALOG) 0.1 %    Sig: Apply 1 application topically 2 (two) times daily.    Dispense:  30 g    Refill:  2  . triamcinolone acetonide (KENALOG-40) injection 60 mg       Follow-up: Return if symptoms worsen or fail to improve.  An After Visit Summary was printed and given to the patient.  [place] [phone]

## 2019-10-26 ENCOUNTER — Other Ambulatory Visit: Payer: Self-pay

## 2019-10-26 ENCOUNTER — Ambulatory Visit: Payer: Medicare HMO

## 2019-10-26 DIAGNOSIS — Z23 Encounter for immunization: Secondary | ICD-10-CM

## 2019-12-04 ENCOUNTER — Other Ambulatory Visit: Payer: Self-pay | Admitting: Family Medicine

## 2019-12-21 ENCOUNTER — Ambulatory Visit: Payer: Medicare HMO | Admitting: Family Medicine

## 2019-12-27 ENCOUNTER — Other Ambulatory Visit: Payer: Self-pay | Admitting: Family Medicine

## 2019-12-27 ENCOUNTER — Other Ambulatory Visit: Payer: Self-pay | Admitting: *Deleted

## 2019-12-27 DIAGNOSIS — H6011 Cellulitis of right external ear: Secondary | ICD-10-CM | POA: Diagnosis not present

## 2019-12-27 DIAGNOSIS — R921 Mammographic calcification found on diagnostic imaging of breast: Secondary | ICD-10-CM

## 2020-02-06 ENCOUNTER — Other Ambulatory Visit: Payer: Self-pay

## 2020-02-06 ENCOUNTER — Ambulatory Visit
Admission: RE | Admit: 2020-02-06 | Discharge: 2020-02-06 | Disposition: A | Discharge status: Home / Self Care | Payer: Medicare HMO | Source: Ambulatory Visit | Attending: Family Medicine | Admitting: Family Medicine

## 2020-02-06 DIAGNOSIS — R921 Mammographic calcification found on diagnostic imaging of breast: Secondary | ICD-10-CM | POA: Diagnosis not present

## 2020-02-24 DIAGNOSIS — K591 Functional diarrhea: Secondary | ICD-10-CM | POA: Diagnosis not present

## 2020-03-05 ENCOUNTER — Encounter: Payer: Self-pay | Admitting: Family Medicine

## 2020-06-02 DIAGNOSIS — Z20828 Contact with and (suspected) exposure to other viral communicable diseases: Secondary | ICD-10-CM | POA: Diagnosis not present

## 2020-07-30 DIAGNOSIS — R0789 Other chest pain: Secondary | ICD-10-CM | POA: Diagnosis not present

## 2020-07-30 DIAGNOSIS — R911 Solitary pulmonary nodule: Secondary | ICD-10-CM | POA: Diagnosis not present

## 2020-07-30 DIAGNOSIS — R918 Other nonspecific abnormal finding of lung field: Secondary | ICD-10-CM | POA: Diagnosis not present

## 2020-07-30 DIAGNOSIS — R569 Unspecified convulsions: Secondary | ICD-10-CM | POA: Diagnosis not present

## 2020-07-30 DIAGNOSIS — R079 Chest pain, unspecified: Secondary | ICD-10-CM | POA: Diagnosis not present

## 2020-08-31 ENCOUNTER — Telehealth: Payer: Self-pay

## 2020-08-31 NOTE — Telephone Encounter (Signed)
Called pt left VM for pt to call to schedule AWV which can be done over the phone.  KP

## 2020-09-10 DIAGNOSIS — C8594 Non-Hodgkin lymphoma, unspecified, lymph nodes of axilla and upper limb: Secondary | ICD-10-CM | POA: Diagnosis not present

## 2020-09-24 ENCOUNTER — Other Ambulatory Visit: Payer: Self-pay

## 2020-09-24 ENCOUNTER — Encounter: Payer: Self-pay | Admitting: Family Medicine

## 2020-09-24 ENCOUNTER — Ambulatory Visit (INDEPENDENT_AMBULATORY_CARE_PROVIDER_SITE_OTHER): Payer: Medicare HMO | Admitting: Family Medicine

## 2020-09-24 VITALS — BP 118/68 | HR 70 | Temp 96.7°F | Ht 60.0 in | Wt 122.0 lb

## 2020-09-24 DIAGNOSIS — E782 Mixed hyperlipidemia: Secondary | ICD-10-CM | POA: Diagnosis not present

## 2020-09-24 DIAGNOSIS — Z780 Asymptomatic menopausal state: Secondary | ICD-10-CM | POA: Diagnosis not present

## 2020-09-24 DIAGNOSIS — R911 Solitary pulmonary nodule: Secondary | ICD-10-CM

## 2020-09-24 DIAGNOSIS — R413 Other amnesia: Secondary | ICD-10-CM | POA: Diagnosis not present

## 2020-09-24 DIAGNOSIS — Z0001 Encounter for general adult medical examination with abnormal findings: Secondary | ICD-10-CM | POA: Diagnosis not present

## 2020-09-24 DIAGNOSIS — F0280 Dementia in other diseases classified elsewhere without behavioral disturbance: Secondary | ICD-10-CM | POA: Insufficient documentation

## 2020-09-24 DIAGNOSIS — Z23 Encounter for immunization: Secondary | ICD-10-CM | POA: Diagnosis not present

## 2020-09-24 DIAGNOSIS — Z13820 Encounter for screening for osteoporosis: Secondary | ICD-10-CM | POA: Diagnosis not present

## 2020-09-24 DIAGNOSIS — F02A0 Dementia in other diseases classified elsewhere, mild, without behavioral disturbance, psychotic disturbance, mood disturbance, and anxiety: Secondary | ICD-10-CM | POA: Insufficient documentation

## 2020-09-24 DIAGNOSIS — M810 Age-related osteoporosis without current pathological fracture: Secondary | ICD-10-CM | POA: Insufficient documentation

## 2020-09-24 NOTE — Patient Instructions (Addendum)
Recommend calcium citrate or carbonate with vitamin D 1200-1500 mg daily. Ordering bone density.  Checking lab work.  Ordering MRI of brain for memory loss if labs normal.  Request report from [facility].

## 2020-09-24 NOTE — Progress Notes (Signed)
Subjective:  Patient ID: Kimberly Estrada, female    DOB: 1945/08/09  Age: 75 y.o. MRN: [id number]  Chief Complaint  Patient presents with   AWV    HPI Well Adult Physical: Patient here for a comprehensive physical exam.The patient reports no problems Do you take any herbs or supplements that were not prescribed by a doctor? no Are you taking calcium supplements? no Are you taking aspirin daily? no  Encounter for general adult medical examination without abnormal findings  Physical ("At Risk" items are starred): Patient's last physical exam was 1 year ago .  Smoking: Life-long non-smoker ;  Physical Activity: Exercises at least 3 times per week ; walking 2 miles. Alcohol/Drug Use: Is a non-drinker ; No illicit drug use ;  Patient is not afflicted from Stress Incontinence and Urge Incontinence  Safety: reviewed. Patient wears a seat belt, has smoke detectors, has carbon monoxide detectors, practices appropriate gun safety, and wears sunscreen with extended sun exposure. Dental Care: biannual cleanings, brushes and flosses daily. Ophthalmology/Optometry: Annual visit.  Hearing loss: Hearing aids Vision impairments: Reading Glasses  LMP: S/P menopause 75 yo Pregnancy history: [id number]. Safe at home:yes Self breast exams: yes  Dexa-07-15-2018 Mammogram 02-01/2020  Mr. Caryl Comes is very concerned about Mrs. Clagnaz memory.  She is forgetting her children's names.  She forgets things he told her earlier on the day.  He denies Any strokelike episodes.  He does not report she had a spell while they were driving to the [place] for his appointment in July.  He was on 85 andRecommend calcium citrate or carbonate with vitamin D 1200-1500 mg daily. Ordering bone density.  Checking lab work.  Ordering MRI of brain for memory loss if labs normal.  Mr Jocelyn reported he had to take his wife to [place] [facility] in July   "Had a [facility] moment" her husband said she felt flushed from the waist and was  flailing her hands.  They were on [address] on the way to the [place]. It is a thorough cardiac work-up at [facility] including troponins x3 which were normal.  Had a CTA of the chest per the husband who is says it was normal other than it had a small nodule that requires follow-up in 3 to 6 months. [place] Office Visit from 09/24/2020 in [place]  PHQ-2 Total Score 0       Current Exercise Habits: Home exercise routine, Type of exercise: walking, Time (Minutes): 30, Frequency (Times/Week): 5, Weekly Exercise (Minutes/Week): 150, Intensity: Mild    Functional Status Survey: Is the patient deaf or have difficulty hearing?: Yes (uses hearing aids) Does the patient have difficulty seeing, even when wearing glasses/contacts?: No Does the patient have difficulty concentrating, remembering, or making decisions?: No Does the patient have difficulty walking or climbing stairs?: No Does the patient have difficulty dressing or bathing?: No Does the patient have difficulty doing errands alone such as visiting a doctor's office or shopping?: No    Social Hx   Social History   Socioeconomic History   Marital status: Married    Spouse name: Persephonie Hegwood   Number of children: 3   Years of education: Not on file   Highest education level: Not on file  Occupational History   Not on file  Tobacco Use   Smoking status: Never   Smokeless tobacco: Never  Substance and Sexual Activity   Alcohol use: Never   Drug use: Never   Sexual activity: Yes  Partners: Female  Other Topics Concern   Not on file  Social History Narrative   Not on file   Social Determinants of Health   Financial Resource Strain: Not on file  Food Insecurity: Not on file  Transportation Needs: Not on file  Physical Activity: Not on file  Stress: Not on file  Social Connections: Not on file   Past Medical History:  Diagnosis Date   GAD (generalized anxiety disorder)    Mixed hyperlipidemia    Nummular dermatitis     Osteoporosis    Palpitations    Past Surgical History:  Procedure Laterality Date   CATARACT EXTRACTION Bilateral     Family History  Problem Relation Age of Onset   Breast cancer Mother 75   CAD Father    Cancer Sister        colon    Review of Systems  Constitutional:  Negative for chills, fatigue and fever.  HENT:  Negative for congestion, ear pain, rhinorrhea and sore throat.   Respiratory:  Negative for cough and shortness of breath.   Cardiovascular:  Negative for chest pain.  Gastrointestinal:  Negative for abdominal pain, constipation, diarrhea, nausea and vomiting.  Genitourinary:  Negative for dysuria and urgency.  Musculoskeletal:  Negative for back pain and myalgias.  Neurological:  Negative for dizziness, weakness, light-headedness and headaches.       Memory loss.   Psychiatric/Behavioral:  Negative for dysphoric mood. The patient is not nervous/anxious.     Objective:  BP 118/68   Pulse 70   Temp (!) 96.7 F (35.9 C)   Ht 5' (1.524 m)   Wt 122 lb (55.3 kg)   SpO2 97%   BMI 23.83 kg/m   BP/Weight 09/24/2020 09/20/2019 6/43/3295  Systolic BP 188 416 606  Diastolic BP 68 88 68  Wt. (Lbs) 122 117 116  BMI 23.83 23.12 22.65    Physical Exam Vitals reviewed.  Constitutional:      General: She is not in acute distress.    Appearance: Normal appearance. She is normal weight.  HENT:     Right Ear: Tympanic membrane and ear canal normal.     Left Ear: Tympanic membrane and ear canal normal.     Nose: Nose normal. No congestion or rhinorrhea.  Eyes:     Conjunctiva/sclera: Conjunctivae normal.  Neck:     Thyroid: No thyroid mass.     Vascular: No carotid bruit.  Cardiovascular:     Rate and Rhythm: Normal rate and regular rhythm.     Pulses: Normal pulses.     Heart sounds: Normal heart sounds. No murmur heard. Pulmonary:     Effort: Pulmonary effort is normal.     Breath sounds: Normal breath sounds.  Abdominal:     General: Bowel sounds are  normal.     Palpations: Abdomen is soft. There is no mass.     Tenderness: There is no abdominal tenderness.  Musculoskeletal:        General: Normal range of motion.  Lymphadenopathy:     Cervical: No cervical adenopathy.  Skin:    General: Skin is warm and dry.  Neurological:     Mental Status: She is alert and oriented to person, place, and time.     Cranial Nerves: No cranial nerve deficit.  Psychiatric:        Mood and Affect: Mood normal.        Behavior: Behavior normal.  Unable to complete CIT. Confabulated. Got frustrated and  would not complete.   No results found for: WBC, HGB, HCT, PLT, GLUCOSE, CHOL, TRIG, HDL, LDLDIRECT, LDLCALC, ALT, AST, NA, K, CL, CREATININE, BUN, CO2, TSH, PSA, INR, GLUF, HGBA1C, MICROALBUR    Assessment & Plan:   Problem List Items Addressed This Visit       Other   Encounter for routine adult health examination with abnormal findings - Primary    Recommend calcium citrate or carbonate with vitamin D 1200-1500 mg daily. Ordering bone density.  Checking lab work.  Ordering MRI of brain for memory loss if labs normal.  Request report from [facility].      Memory loss    Checking lab work.  Ordering MRI of brain for memory loss if labs normal.        Relevant Orders   CBC with Differential/Platelet   Comprehensive metabolic panel   TSH   J03 and Folate Panel   Methylmalonic acid, serum   Mixed hyperlipidemia    Well controlled.  No changes to medicines.  Continue to work on eating a healthy diet and exercise.  Labs drawn today.        Relevant Orders   Lipid panel   Pulmonary nodule    Per pt's husband found nodule 07/2020. Told needs repeated in 3-6 months.  Request records.      Encounter for osteoporosis screening in asymptomatic postmenopausal patient   Relevant Orders   DG Bone Density   Other Visit Diagnoses     Need for immunization against influenza       Relevant Orders   Flu Vaccine QUAD High Dose(Fluad)  (Completed)        This is a list of the screening recommended for you and due dates:  Health Maintenance  Topic Date Due   Hepatitis C Screening: USPSTF Recommendation to screen - Ages 1-79 yo.  Never done   Tetanus Vaccine  Never done   Zoster (Shingles) Vaccine (1 of 2) Never done   COVID-19 Vaccine (4 - Booster for Moderna series) 03/28/2020   Colon Cancer Screening  06/07/2021   Mammogram  02/05/2022   Flu Shot  Completed   DEXA scan (bone density measurement)  Completed   HPV Vaccine  Aged Out     AN INDIVIDUALIZED CARE PLAN: was established or reinforced today.   SELF MANAGEMENT: The patient and I together assessed ways to personally work towards obtaining the recommended goals  Support needs The patient and/or family needs were assessed and services were offered if appropriate.  No orders of the defined types were placed in this encounter.   Follow-up: Return in about 6 weeks (around 11/05/2020) for memory issues.  An After Visit Summary was printed and given to the patient.  Rochel Brome, MD [facility] [phone]

## 2020-09-24 NOTE — Assessment & Plan Note (Signed)
Well controlled.  ?No changes to medicines.  ?Continue to work on eating a healthy diet and exercise.  ?Labs drawn today.  ?

## 2020-09-24 NOTE — Assessment & Plan Note (Signed)
Checking lab work.  Ordering MRI of brain for memory loss if labs normal.

## 2020-09-24 NOTE — Assessment & Plan Note (Signed)
Per pt's husband found nodule 07/2020. Told needs repeated in 3-6 months.  Request records.

## 2020-09-24 NOTE — Assessment & Plan Note (Signed)
Recommend calcium citrate or carbonate with vitamin D 1200-1500 mg daily. Ordering bone density.  Checking lab work.  Ordering MRI of brain for memory loss if labs normal.  Request report from [facility].

## 2020-09-27 LAB — COMPREHENSIVE METABOLIC PANEL
ALT: 12 IU/L (ref 0–32)
AST: 19 IU/L (ref 0–40)
Albumin/Globulin Ratio: 2.1 (ref 1.2–2.2)
Albumin: 4.8 g/dL — ABNORMAL HIGH (ref 3.7–4.7)
Alkaline Phosphatase: 72 IU/L (ref 44–121)
BUN/Creatinine Ratio: 12 (ref 12–28)
BUN: 9 mg/dL (ref 8–27)
Bilirubin Total: 0.5 mg/dL (ref 0.0–1.2)
CO2: 23 mmol/L (ref 20–29)
Calcium: 10.1 mg/dL (ref 8.7–10.3)
Chloride: 103 mmol/L (ref 96–106)
Creatinine, Ser: 0.77 mg/dL (ref 0.57–1.00)
Globulin, Total: 2.3 g/dL (ref 1.5–4.5)
Glucose: 97 mg/dL (ref 65–99)
Potassium: 4.3 mmol/L (ref 3.5–5.2)
Sodium: 142 mmol/L (ref 134–144)
Total Protein: 7.1 g/dL (ref 6.0–8.5)
eGFR: 81 mL/min/{1.73_m2} (ref >59)

## 2020-09-27 LAB — LIPID PANEL
Chol/HDL Ratio: 3.9 ratio (ref 0.0–4.4)
Cholesterol, Total: 208 mg/dL — ABNORMAL HIGH (ref 100–199)
HDL: 54 mg/dL (ref >39)
LDL Chol Calc (NIH): 107 mg/dL — ABNORMAL HIGH (ref 0–99)
Triglycerides: 277 mg/dL — ABNORMAL HIGH (ref 0–149)
VLDL Cholesterol Cal: 47 mg/dL — ABNORMAL HIGH (ref 5–40)

## 2020-09-27 LAB — B12 AND FOLATE PANEL
Folate: 11 ng/mL (ref >3.0)
Vitamin B-12: 291 pg/mL (ref 232–1245)

## 2020-09-27 LAB — CBC WITH DIFFERENTIAL/PLATELET
Basophils Absolute: 0 10*3/uL (ref 0.0–0.2)
Basos: 1 %
EOS (ABSOLUTE): 0.1 10*3/uL (ref 0.0–0.4)
Eos: 1 %
Hematocrit: 45.8 % (ref 34.0–46.6)
Hemoglobin: 15.6 g/dL (ref 11.1–15.9)
Immature Grans (Abs): 0 10*3/uL (ref 0.0–0.1)
Immature Granulocytes: 0 %
Lymphocytes Absolute: 3 10*3/uL (ref 0.7–3.1)
Lymphs: 40 %
MCH: 29.9 pg (ref 26.6–33.0)
MCHC: 34.1 g/dL (ref 31.5–35.7)
MCV: 88 fL (ref 79–97)
Monocytes Absolute: 0.9 10*3/uL (ref 0.1–0.9)
Monocytes: 12 %
Neutrophils Absolute: 3.5 10*3/uL (ref 1.4–7.0)
Neutrophils: 46 %
Platelets: 259 10*3/uL (ref 150–450)
RBC: 5.22 x10E6/uL (ref 3.77–5.28)
RDW: 12.9 % (ref 11.7–15.4)
WBC: 7.5 10*3/uL (ref 3.4–10.8)

## 2020-09-27 LAB — CARDIOVASCULAR RISK ASSESSMENT

## 2020-09-27 LAB — METHYLMALONIC ACID, SERUM: Methylmalonic Acid: 216 nmol/L (ref 0–378)

## 2020-09-27 LAB — TSH: TSH: 1.48 u[IU]/mL (ref 0.450–4.500)

## 2020-09-28 ENCOUNTER — Other Ambulatory Visit: Payer: Self-pay | Admitting: Family Medicine

## 2020-09-28 DIAGNOSIS — R413 Other amnesia: Secondary | ICD-10-CM

## 2020-09-30 ENCOUNTER — Other Ambulatory Visit: Payer: Self-pay | Admitting: Family Medicine

## 2020-09-30 ENCOUNTER — Telehealth: Payer: Self-pay | Admitting: Family Medicine

## 2020-09-30 ENCOUNTER — Encounter: Payer: Self-pay | Admitting: Family Medicine

## 2020-09-30 ENCOUNTER — Other Ambulatory Visit: Payer: Self-pay

## 2020-09-30 MED ORDER — OMEGA-3-ACID ETHYL ESTERS 1 G PO CAPS: 2.0000 g | ORAL_CAPSULE | Freq: Two times a day (BID) | ORAL | 0 refills | Status: DC

## 2020-09-30 MED ORDER — ICOSAPENT ETHYL 1 G PO CAPS: 2.0000 g | ORAL_CAPSULE | Freq: Two times a day (BID) | ORAL | 2 refills | Status: DC

## 2020-09-30 NOTE — Telephone Encounter (Signed)
   Kimberly Estrada has been scheduled for the following appointment:  WHAT: BONE DENSITY WHERE: [facility] DATE: 12/04/20  TIME: 2:30 PM ARRIVAL TIME  A message has been left for the patient. ALSO MAILED LETTER

## 2020-10-08 ENCOUNTER — Telehealth: Payer: Self-pay | Admitting: Family Medicine

## 2020-10-08 NOTE — Telephone Encounter (Signed)
MRI Brain Peer to Peer Approval: I spoke with Dr. Jacqualyn Posey with insurance company. Discussed pt history. Approval number: [id number] Exp date: 04/06/2021

## 2020-10-10 DIAGNOSIS — G319 Degenerative disease of nervous system, unspecified: Secondary | ICD-10-CM | POA: Diagnosis not present

## 2020-10-10 DIAGNOSIS — R413 Other amnesia: Secondary | ICD-10-CM | POA: Diagnosis not present

## 2020-10-14 ENCOUNTER — Other Ambulatory Visit: Payer: Self-pay

## 2020-10-14 DIAGNOSIS — R413 Other amnesia: Secondary | ICD-10-CM

## 2020-10-14 MED ORDER — DONEPEZIL HCL 5 MG PO TBDP: 5.0000 mg | ORAL_TABLET | Freq: Every day | ORAL | 0 refills | Status: DC

## 2020-11-12 ENCOUNTER — Encounter: Payer: Self-pay | Admitting: Family Medicine

## 2020-11-12 ENCOUNTER — Other Ambulatory Visit: Payer: Self-pay

## 2020-11-12 ENCOUNTER — Ambulatory Visit (INDEPENDENT_AMBULATORY_CARE_PROVIDER_SITE_OTHER): Payer: Medicare HMO | Admitting: Family Medicine

## 2020-11-12 VITALS — BP 132/72 | HR 72 | Temp 97.9°F | Resp 16 | Ht 60.0 in | Wt 119.0 lb

## 2020-11-12 DIAGNOSIS — R911 Solitary pulmonary nodule: Secondary | ICD-10-CM | POA: Diagnosis not present

## 2020-11-12 DIAGNOSIS — R69 Illness, unspecified: Secondary | ICD-10-CM | POA: Diagnosis not present

## 2020-11-12 DIAGNOSIS — G301 Alzheimer's disease with late onset: Secondary | ICD-10-CM | POA: Diagnosis not present

## 2020-11-12 DIAGNOSIS — R413 Other amnesia: Secondary | ICD-10-CM

## 2020-11-12 DIAGNOSIS — J018 Other acute sinusitis: Secondary | ICD-10-CM | POA: Diagnosis not present

## 2020-11-12 DIAGNOSIS — F02A18 Dementia in other diseases classified elsewhere, mild, with other behavioral disturbance: Secondary | ICD-10-CM

## 2020-11-12 MED ORDER — AMOXICILLIN 875 MG PO TABS: 875.0000 mg | ORAL_TABLET | Freq: Two times a day (BID) | ORAL | 0 refills | Status: AC

## 2020-11-12 MED ORDER — DONEPEZIL HCL 10 MG PO TABS: 10.0000 mg | ORAL_TABLET | Freq: Every day | ORAL | 1 refills | Status: DC

## 2020-11-12 NOTE — Patient Instructions (Signed)
Increase aricept to 10 mg once daily at night.  Amoxicillin rx sent for sinusitis.

## 2020-11-12 NOTE — Progress Notes (Signed)
Subjective:  Patient ID: Kimberly Estrada, female    DOB: 1945-02-24  Age: 75 y.o. MRN: [id number]  Chief Complaint  Patient presents with   Memory loss    6 week follow up   Cough    HPI Patient returns for follow-up of memory loss.  She was started on Aricept 5 mg 1 at night.  MRI was consistent with Alzheimer's dementia.  Tolerating the medicine and her family did not feel like it helps.  Complaining of cough, clear drainage from left nostril.  No fevers. MMSE - Mini Mental State Exam 11/12/2020  Orientation to time 3  Orientation to Place 3  Registration 3  Attention/ Calculation 4  Recall 0  Language- name 2 objects 2  Language- repeat 1  Language- follow 3 step command 3  Language- read & follow direction 1  Write a sentence 1  Copy design 1  Total score 22       Current Outpatient Medications on File Prior to Visit  Medication Sig Dispense Refill   co-enzyme Q-10 30 MG capsule Take by mouth.     meloxicam (MOBIC) 15 MG tablet TAKE 1 TABLET BY MOUTH EVERY DAY 90 tablet 1   omega-3 acid ethyl esters (LOVAZA) 1 g capsule Take 2 capsules (2 g total) by mouth 2 (two) times daily. 360 capsule 0   pravastatin (PRAVACHOL) 80 MG tablet TAKE 1/2 TABLET BY MOUTH EVERYDAY AT BEDTIME 45 tablet 3   triamcinolone cream (KENALOG) 0.1 % Apply 1 application topically 2 (two) times daily. 30 g 2   No current facility-administered medications on file prior to visit.   Past Medical History:  Diagnosis Date   GAD (generalized anxiety disorder)    Mixed hyperlipidemia    Nummular dermatitis    Osteoporosis    Palpitations    Past Surgical History:  Procedure Laterality Date   CATARACT EXTRACTION Bilateral     Family History  Problem Relation Age of Onset   Breast cancer Mother 22   CAD Father    Cancer Sister        colon   Social History   Socioeconomic History   Marital status: Married    Spouse name: Brantley Naser   Number of children: 3   Years of education:  Not on file   Highest education level: Not on file  Occupational History   Not on file  Tobacco Use   Smoking status: Never   Smokeless tobacco: Never  Substance and Sexual Activity   Alcohol use: Never   Drug use: Never   Sexual activity: Yes    Partners: Female  Other Topics Concern   Not on file  Social History Narrative   Not on file   Social Determinants of Health   Financial Resource Strain: Not on file  Food Insecurity: Not on file  Transportation Needs: Not on file  Physical Activity: Not on file  Stress: Not on file  Social Connections: Not on file    Review of Systems  Constitutional:  Negative for fever.  HENT:  Positive for rhinorrhea. Negative for sinus pressure and sore throat.   Eyes:  Negative for pain.  Respiratory:  Positive for cough. Negative for shortness of breath and wheezing.   Gastrointestinal:  Negative for nausea and vomiting.  Genitourinary:  Negative for dysuria and hematuria.  Skin:  Negative for rash.  Neurological:  Positive for headaches.    Objective:  BP 132/72   Pulse 72   Temp 97.9  F (36.6 C)   Resp 16   Ht 5' (1.524 m)   Wt 119 lb (54 kg)   BMI 23.24 kg/m   BP/Weight 11/12/2020 09/24/2020 8/50/2774  Systolic BP 128 786 767  Diastolic BP 72 68 88  Wt. (Lbs) 119 122 117  BMI 23.24 23.83 23.12    Physical Exam Vitals reviewed.  Constitutional:      Appearance: Normal appearance. She is normal weight.  HENT:     Right Ear: Tympanic membrane normal.     Left Ear: Tympanic membrane normal.     Nose: Congestion and rhinorrhea present.     Comments: Sinus tenderness    Mouth/Throat:     Pharynx: No oropharyngeal exudate or posterior oropharyngeal erythema.  Neck:     Vascular: No carotid bruit.  Cardiovascular:     Rate and Rhythm: Normal rate and regular rhythm.     Pulses: Normal pulses.     Heart sounds: Normal heart sounds.  Pulmonary:     Effort: Pulmonary effort is normal. No respiratory distress.     Breath  sounds: Normal breath sounds.  Neurological:     Mental Status: She is alert and oriented to person, place, and time.  Psychiatric:        Mood and Affect: Mood normal.        Behavior: Behavior normal.    Diabetic Foot Exam - Simple   No data filed      Lab Results  Component Value Date   WBC 7.5 09/24/2020   HGB 15.6 09/24/2020   HCT 45.8 09/24/2020   PLT 259 09/24/2020   GLUCOSE 97 09/24/2020   CHOL 208 (H) 09/24/2020   TRIG 277 (H) 09/24/2020   HDL 54 09/24/2020   LDLCALC 107 (H) 09/24/2020   ALT 12 09/24/2020   AST 19 09/24/2020   NA 142 09/24/2020   K 4.3 09/24/2020   CL 103 09/24/2020   CREATININE 0.77 09/24/2020   BUN 9 09/24/2020   CO2 23 09/24/2020   TSH 1.480 09/24/2020      Assessment & Plan:   Problem List Items Addressed This Visit       Respiratory   Sinusitis    Rx: amoxicillin sent      Relevant Medications   amoxicillin (AMOXIL) 875 MG tablet     Nervous and Auditory   Alzheimer's dementia ([place]) - Primary    Increase aricept 10 mg once daily      Relevant Medications   donepezil (ARICEPT) 10 MG tablet     Other   Pulmonary nodule    I do not have ct scan of chest. Request records.      .  Meds ordered this encounter  Medications   amoxicillin (AMOXIL) 875 MG tablet    Sig: Take 1 tablet (875 mg total) by mouth 2 (two) times daily for 10 days.    Dispense:  20 tablet    Refill:  0   donepezil (ARICEPT) 10 MG tablet    Sig: Take 1 tablet (10 mg total) by mouth at bedtime.    Dispense:  90 tablet    Refill:  1      Follow-up: Return in about 6 weeks (around 12/25/2020) for chronic fasting.  An After Visit Summary was printed and given to the patient.   I,Lauren M Auman,acting as a scribe for Rochel Brome, MD.,have documented all relevant documentation on the behalf of Rochel Brome, MD,as directed by  Rochel Brome, MD while in  the presence of Rochel Brome, MD.    Rochel Brome, MD [place] [phone]

## 2020-11-16 IMAGING — MG MM BREAST BX W/ LOC DEV 1ST LESION IMAGE BX SPEC STEREO GUIDE*R*
7 of 13 series · 7 of 17 positions shown · non-contrast
Comparison: Previous exams.
COMPARISON: Previous exams.
COMPARISON: Previous exams.

Addendum:
CLINICAL DATA: Biopsy of calcifications in the upper outer right
breast

EXAM:
RIGHT BREAST STEREOTACTIC CORE NEEDLE BIOPSY

[R (1 of 7)]
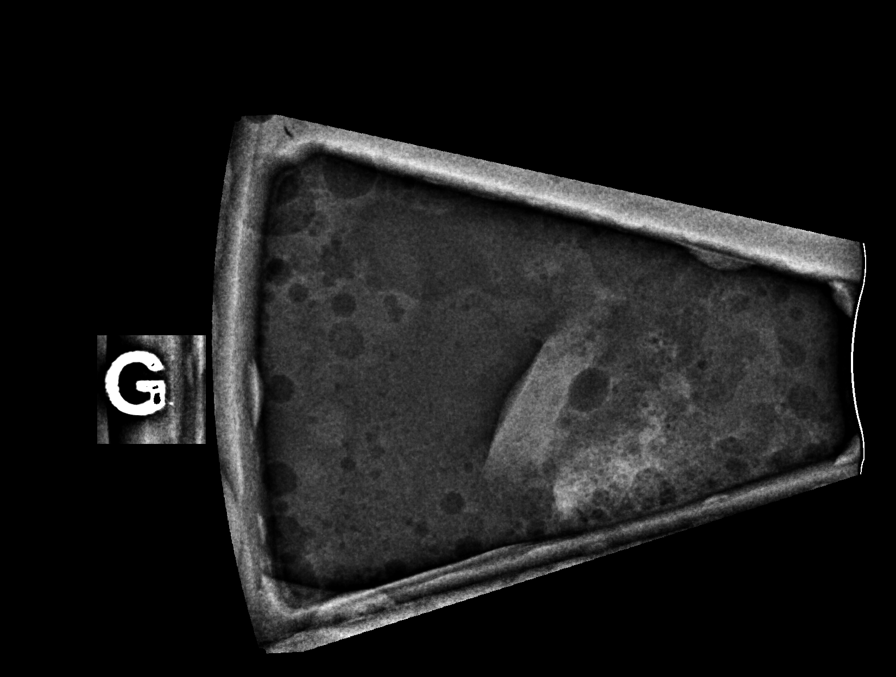

[R (2 of 7)]
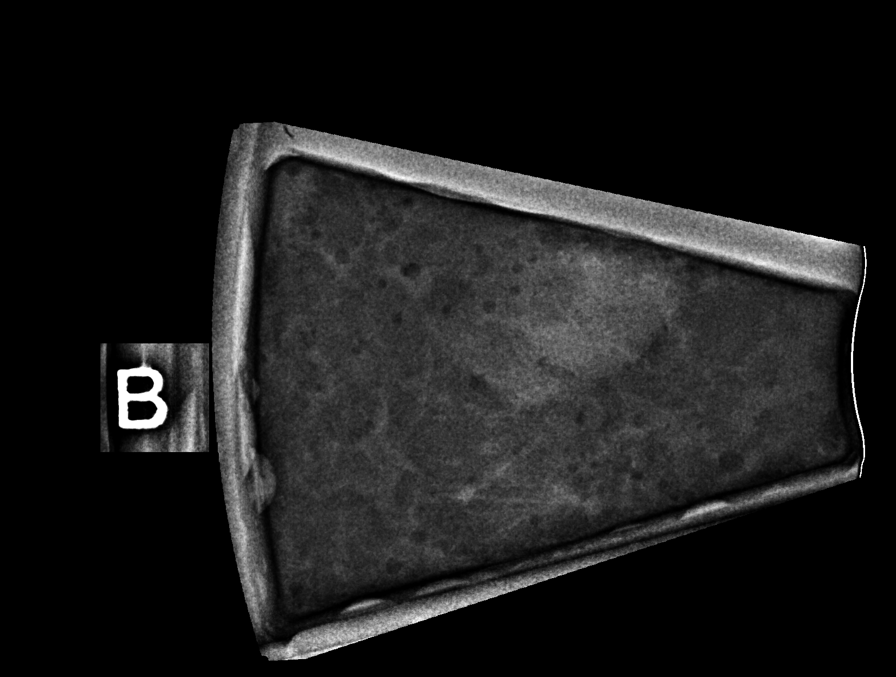

[R (3 of 7)]
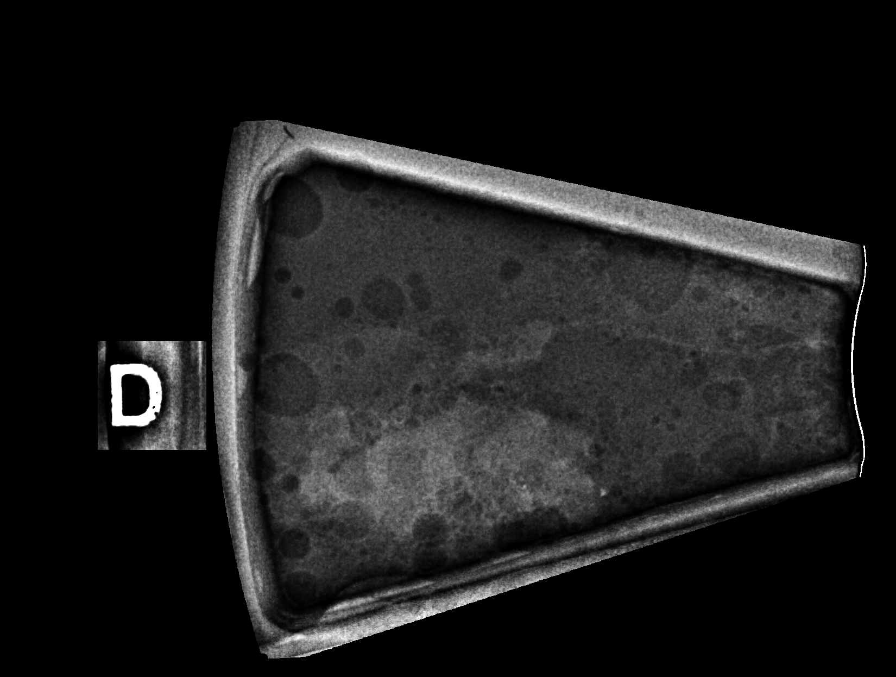

[R (4 of 7)]
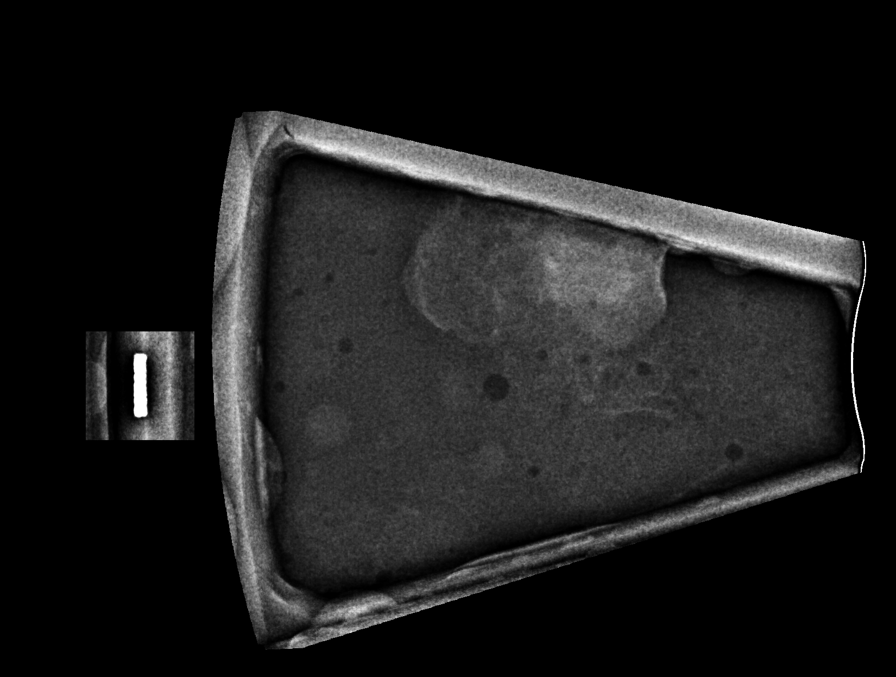

[R (5 of 7)]
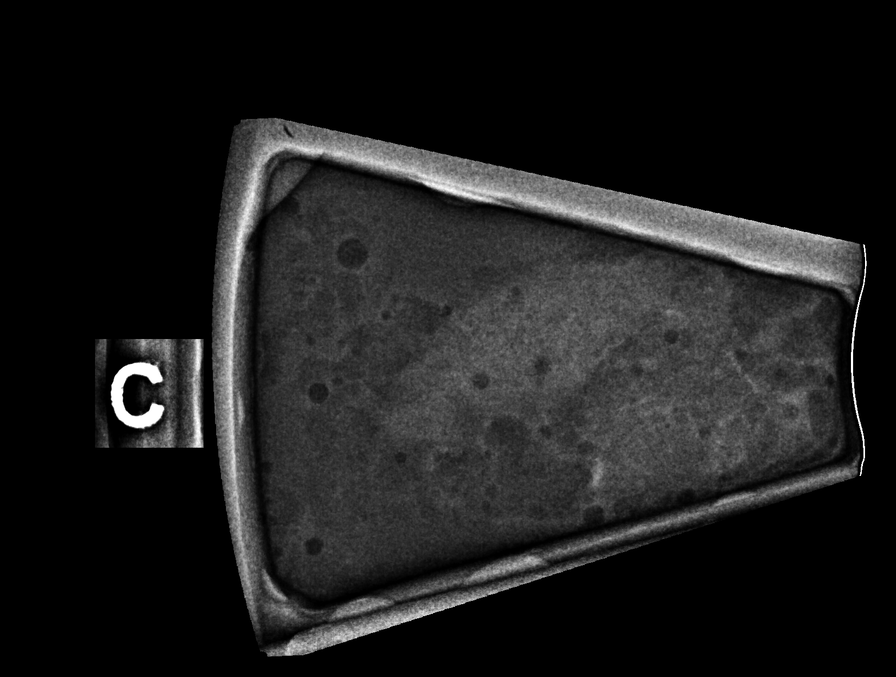

[R (6 of 7)]
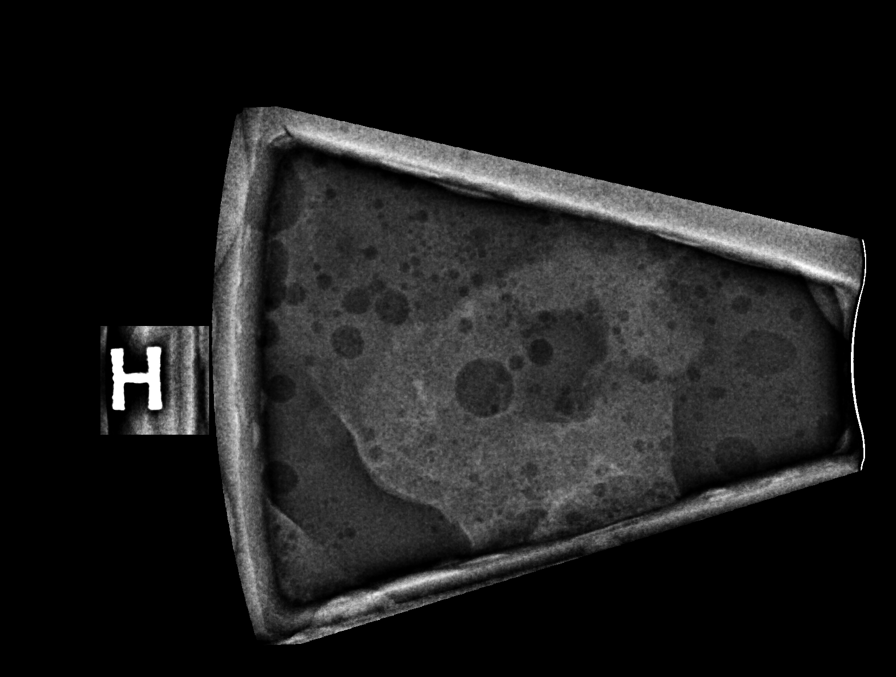

[R (7 of 7)]
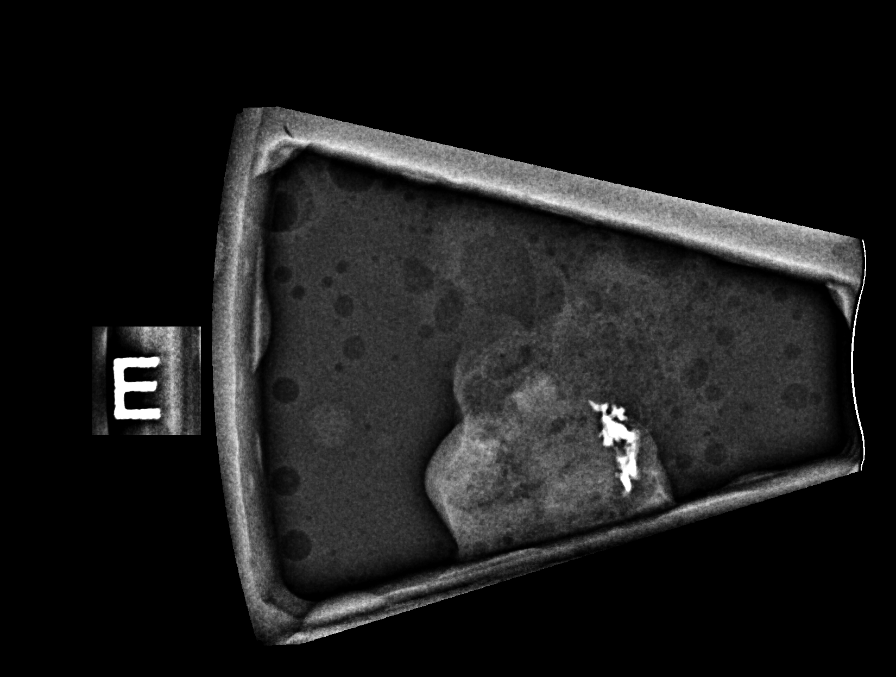

[7 of 17 positions shown; findings below may reference images not displayed]



Using sterile technique and 1% Lidocaine as local anesthetic, under
stereotactic guidance, a 9 gauge vacuum assisted device was used to
perform core needle biopsy of calcifications in the upper outer
right breast using a superior approach. Specimen radiograph was
performed showing numerous calcifications in 1 core specimen.
Specimens with calcifications are identified for pathology.

Lesion quadrant: Upper-outer

At the conclusion of the procedure, coil shaped tissue marker clip
was deployed into the biopsy cavity. Follow-up 2-view mammogram was
performed and dictated separately.
IMPRESSION: Stereotactic-guided biopsy of calcifications in the right breast. No
apparent complications.

ADDENDUM:
Pathology revealed EXTENSIVE FIBROADENOMATOID CHANGE WITH
CALCIFICATIONS of the RIGHT breast, upper outer. These changes in
the presence of a clinical mass could represent a fibroadenoma. This
was found to be concordant by Dr. Kuok Kam Valdoz.

Pathology results were discussed with the patient by telephone. The
patient reported doing well after the biopsy with tenderness at the
site. Post biopsy instructions and care were reviewed and questions
were answered. The patient was encouraged to call The [REDACTED]

The patient was instructed to return for right diagnostic
mammography in 12 months and informed a reminder notice would be
sent regarding this appointment.

Pathology results reported by Alia Tiger RN on 02/02/2019.

ADDENDUM:
Correction to original addendum: The patient was instructed to
return for Bilateral diagnostic mammography in 12 months and
informed a reminder notice would be sent regarding this appointment.

Alia Tiger RN on 02/03/2019.

*** End of Addendum ***
Addendum:
FINDINGS: The patient and I discussed the procedure of stereotactic-guided
biopsy including benefits and alternatives. We discussed the high
likelihood of a successful procedure. We discussed the risks of the
procedure including infection, bleeding, tissue injury, clip
migration, and inadequate sampling. Informed written consent was
given. The usual time out protocol was performed immediately prior
to the procedure.

Using sterile technique and 1% Lidocaine as local anesthetic, under
stereotactic guidance, a 9 gauge vacuum assisted device was used to
perform core needle biopsy of calcifications in the upper outer
right breast using a superior approach. Specimen radiograph was
performed showing numerous calcifications in 1 core specimen.
Specimens with calcifications are identified for pathology.

Lesion quadrant: Upper-outer

At the conclusion of the procedure, coil shaped tissue marker clip
was deployed into the biopsy cavity. Follow-up 2-view mammogram was
performed and dictated separately.
IMPRESSION: Stereotactic-guided biopsy of calcifications in the right breast. No
apparent complications.

ADDENDUM:
Pathology revealed EXTENSIVE FIBROADENOMATOID CHANGE WITH
CALCIFICATIONS of the RIGHT breast, upper outer. These changes in
the presence of a clinical mass could represent a fibroadenoma. This
was found to be concordant by Dr. Kuok Kam Valdoz.

Pathology results were discussed with the patient by telephone. The
patient reported doing well after the biopsy with tenderness at the
site. Post biopsy instructions and care were reviewed and questions
were answered. The patient was encouraged to call The [REDACTED]

The patient was instructed to return for right diagnostic
mammography in 12 months and informed a reminder notice would be
sent regarding this appointment.

Pathology results reported by Alia Tiger RN on 02/02/2019.



Using sterile technique and 1% Lidocaine as local anesthetic, under
stereotactic guidance, a 9 gauge vacuum assisted device was used to
perform core needle biopsy of calcifications in the upper outer
right breast using a superior approach. Specimen radiograph was
performed showing numerous calcifications in 1 core specimen.
Specimens with calcifications are identified for pathology.

Lesion quadrant: Upper-outer

At the conclusion of the procedure, coil shaped tissue marker clip
was deployed into the biopsy cavity. Follow-up 2-view mammogram was
performed and dictated separately.
IMPRESSION: Stereotactic-guided biopsy of calcifications in the right breast. No
apparent complications.

## 2020-11-17 ENCOUNTER — Encounter: Payer: Self-pay | Admitting: Family Medicine

## 2020-11-17 DIAGNOSIS — J329 Chronic sinusitis, unspecified: Secondary | ICD-10-CM | POA: Insufficient documentation

## 2020-11-17 NOTE — Assessment & Plan Note (Signed)
Rx amoxicillin sent.  

## 2020-11-17 NOTE — Assessment & Plan Note (Signed)
I do not have ct scan of chest. Request records.

## 2020-11-17 NOTE — Assessment & Plan Note (Signed)
Increase aricept 10 mg once daily

## 2020-11-22 ENCOUNTER — Other Ambulatory Visit: Payer: Self-pay | Admitting: Physician Assistant

## 2020-12-03 ENCOUNTER — Other Ambulatory Visit: Payer: Self-pay

## 2020-12-03 DIAGNOSIS — R911 Solitary pulmonary nodule: Secondary | ICD-10-CM

## 2020-12-04 ENCOUNTER — Encounter: Payer: Self-pay | Admitting: Family Medicine

## 2020-12-04 DIAGNOSIS — M810 Age-related osteoporosis without current pathological fracture: Secondary | ICD-10-CM | POA: Diagnosis not present

## 2020-12-04 DIAGNOSIS — N959 Unspecified menopausal and perimenopausal disorder: Secondary | ICD-10-CM | POA: Diagnosis not present

## 2020-12-04 LAB — HM DEXA SCAN

## 2020-12-06 ENCOUNTER — Telehealth: Payer: Self-pay | Admitting: Family Medicine

## 2020-12-06 NOTE — Telephone Encounter (Signed)
   Kimberly Estrada has been scheduled for the following appointment:  WHAT: CHEST CT WHERE: [facility] DATE: 12/18/20 TIME: 1:30 PM ARRIVAL TIME  Patient has been made aware. SPOKE TO PT'S HUSBAND KENNY

## 2020-12-18 DIAGNOSIS — I700 Atherosclerosis of aorta: Secondary | ICD-10-CM | POA: Diagnosis not present

## 2020-12-18 DIAGNOSIS — R911 Solitary pulmonary nodule: Secondary | ICD-10-CM | POA: Diagnosis not present

## 2021-01-21 ENCOUNTER — Other Ambulatory Visit: Payer: Self-pay | Admitting: Family Medicine

## 2021-01-21 DIAGNOSIS — Z1231 Encounter for screening mammogram for malignant neoplasm of breast: Secondary | ICD-10-CM

## 2021-02-05 ENCOUNTER — Telehealth: Payer: Self-pay

## 2021-02-05 ENCOUNTER — Other Ambulatory Visit: Payer: Self-pay

## 2021-02-05 DIAGNOSIS — R911 Solitary pulmonary nodule: Secondary | ICD-10-CM

## 2021-02-05 DIAGNOSIS — G301 Alzheimer's disease with late onset: Secondary | ICD-10-CM

## 2021-02-05 NOTE — Telephone Encounter (Signed)
Dr. Everette Rank with [facility] wants referral to and for her dementia. [phone]. Ok per Dr. Tobie Poet, sending to referrals.

## 2021-02-06 ENCOUNTER — Ambulatory Visit: Payer: Medicare HMO

## 2021-02-07 ENCOUNTER — Ambulatory Visit
Admission: RE | Admit: 2021-02-07 | Discharge: 2021-02-07 | Disposition: A | Discharge status: Home / Self Care | Payer: Medicare HMO | Source: Ambulatory Visit | Attending: Family Medicine | Admitting: Family Medicine

## 2021-02-07 DIAGNOSIS — Z1231 Encounter for screening mammogram for malignant neoplasm of breast: Secondary | ICD-10-CM | POA: Diagnosis not present

## 2021-02-18 ENCOUNTER — Ambulatory Visit (INDEPENDENT_AMBULATORY_CARE_PROVIDER_SITE_OTHER): Payer: Medicare HMO | Admitting: Family Medicine

## 2021-02-18 ENCOUNTER — Encounter: Payer: Self-pay | Admitting: Family Medicine

## 2021-02-18 ENCOUNTER — Ambulatory Visit (INDEPENDENT_AMBULATORY_CARE_PROVIDER_SITE_OTHER): Payer: Medicare HMO

## 2021-02-18 ENCOUNTER — Other Ambulatory Visit: Payer: Self-pay

## 2021-02-18 VITALS — BP 106/60 | HR 70 | Temp 97.3°F | Resp 16 | Ht 60.0 in | Wt 118.2 lb

## 2021-02-18 DIAGNOSIS — F02A18 Dementia in other diseases classified elsewhere, mild, with other behavioral disturbance: Secondary | ICD-10-CM

## 2021-02-18 DIAGNOSIS — E782 Mixed hyperlipidemia: Secondary | ICD-10-CM

## 2021-02-18 DIAGNOSIS — M810 Age-related osteoporosis without current pathological fracture: Secondary | ICD-10-CM | POA: Diagnosis not present

## 2021-02-18 DIAGNOSIS — G301 Alzheimer's disease with late onset: Secondary | ICD-10-CM

## 2021-02-18 DIAGNOSIS — R69 Illness, unspecified: Secondary | ICD-10-CM | POA: Diagnosis not present

## 2021-02-18 DIAGNOSIS — Z23 Encounter for immunization: Secondary | ICD-10-CM | POA: Diagnosis not present

## 2021-02-18 MED ORDER — PRAVASTATIN SODIUM 40 MG PO TABS: 40.0000 mg | ORAL_TABLET | Freq: Every day | ORAL | 3 refills | Status: DC

## 2021-02-18 MED ORDER — MEMANTINE HCL-DONEPEZIL HCL ER 28-10 MG PO CP24: 1.0000 | ORAL_CAPSULE | Freq: Every day | ORAL | 2 refills | Status: DC

## 2021-02-18 NOTE — Patient Instructions (Addendum)
Stop donepezil.  Start on [place] which has has donepezil (aricept) and memantadine (namenda) in the same pill.   If persistent irritability, call back and consider starting another medicine.   Recommend prolia every 6 months. We will get approved through insurance. Please call back if you do not hear from [place] in 2 weeks.

## 2021-02-18 NOTE — Progress Notes (Signed)
Subjective:  Patient ID: Kimberly Estrada, female    DOB: November 21, 1945  Age: 76 y.o. MRN: [id number]  Chief Complaint  Patient presents with   alzheimer's dementia   HPI  Patient presents for chronic follow up with her husband and daughter. Patient is having progressive issues with dementia, despite her denial. She is able to bathe, dress. Not cooking, but can make a sandwich. Currently on aricept 10 mg before bed. Patient is resistant to take more medicines. Filled out forms for [facility] to get benefits.  Hyperlipidemia has not been at goal. Currently, on coenzymeq10, loaza, and pravastatin.  Patient becomes agitate and frustrated easily.  Patient was driving to the mall to watch on a regular basis. Her husband is now driving her. She got lost when she tried to drive to her sisters in [place].   Current Outpatient Medications on File Prior to Visit  Medication Sig Dispense Refill   co-enzyme Q-10 30 MG capsule Take by mouth.     meloxicam (MOBIC) 15 MG tablet TAKE 1 TABLET BY MOUTH EVERY DAY 90 tablet 1   omega-3 acid ethyl esters (LOVAZA) 1 g capsule Take 2 capsules (2 g total) by mouth 2 (two) times daily. 360 capsule 0   triamcinolone cream (KENALOG) 0.1 % Apply 1 application topically 2 (two) times daily. 30 g 2   No current facility-administered medications on file prior to visit.   Past Medical History:  Diagnosis Date   GAD (generalized anxiety disorder)    Mixed hyperlipidemia    Nummular dermatitis    Osteoporosis    Palpitations    Past Surgical History:  Procedure Laterality Date   CATARACT EXTRACTION Bilateral     Family History  Problem Relation Age of Onset   Breast cancer Mother 60   CAD Father    Cancer Sister        colon   Social History   Socioeconomic History   Marital status: Married    Spouse name: Rasheen Schewe   Number of children: 3   Years of education: Not on file   Highest education level: Not on file  Occupational History   Not on file  Tobacco  Use   Smoking status: Never   Smokeless tobacco: Never  Substance and Sexual Activity   Alcohol use: Never   Drug use: Never   Sexual activity: Yes    Partners: Female  Other Topics Concern   Not on file  Social History Narrative   Not on file   Social Determinants of Health   Financial Resource Strain: Not on file  Food Insecurity: Not on file  Transportation Needs: Not on file  Physical Activity: Not on file  Stress: Not on file  Social Connections: Not on file    Review of Systems  Constitutional:  Negative for chills, fatigue and fever.  HENT:  Negative for congestion, rhinorrhea and sore throat.   Respiratory:  Negative for cough and shortness of breath.   Cardiovascular:  Negative for chest pain.  Gastrointestinal:  Negative for abdominal pain, constipation, diarrhea, nausea and vomiting.  Genitourinary:  Negative for dysuria and urgency.  Musculoskeletal:  Negative for back pain and myalgias.  Neurological:  Negative for dizziness, weakness, light-headedness and headaches.  Psychiatric/Behavioral:  Negative for dysphoric mood. The patient is not nervous/anxious.     Objective:  BP 106/60    Pulse 70    Temp (!) 97.3 F (36.3 C)    Resp 16    Ht 5' (  1.524 m)    Wt 118 lb 3.2 oz (53.6 kg)    SpO2 97%    BMI 23.08 kg/m   BP/Weight 02/18/2021 11/12/2020 9/93/5701  Systolic BP 779 390 300  Diastolic BP 60 72 68  Wt. (Lbs) 118.2 119 122  BMI 23.08 23.24 23.83    Physical Exam Vitals reviewed.  Constitutional:      Appearance: Normal appearance. She is normal weight.  Neck:     Vascular: No carotid bruit.  Cardiovascular:     Rate and Rhythm: Normal rate and regular rhythm.     Heart sounds: Normal heart sounds.  Pulmonary:     Effort: Pulmonary effort is normal. No respiratory distress.     Breath sounds: Normal breath sounds.  Abdominal:     General: Abdomen is flat. Bowel sounds are normal.     Palpations: Abdomen is soft.     Tenderness: There is no  abdominal tenderness.  Neurological:     Mental Status: She is alert.  Psychiatric:        Mood and Affect: Mood normal.        Behavior: Behavior normal.    Diabetic Foot Exam - Simple   No data filed      Lab Results  Component Value Date   WBC 6.8 02/18/2021   HGB 15.2 02/18/2021   HCT 44.3 02/18/2021   PLT 268 02/18/2021   GLUCOSE 105 (H) 02/18/2021   CHOL 225 (H) 02/18/2021   TRIG 153 (H) 02/18/2021   HDL 63 02/18/2021   LDLCALC 135 (H) 02/18/2021   ALT 16 02/18/2021   AST 20 02/18/2021   NA 140 02/18/2021   K 4.4 02/18/2021   CL 101 02/18/2021   CREATININE 0.84 02/18/2021   BUN 15 02/18/2021   CO2 25 02/18/2021   TSH 1.480 09/24/2020      Assessment & Plan:   Problem List Items Addressed This Visit       Nervous and Auditory   Alzheimer's dementia ([place]) - Primary    Change aricept to namzaric once daily.      Relevant Medications   Memantine HCl-Donepezil HCl 28-10 MG CP24     Musculoskeletal and Integument   Age related osteoporosis    Order prolia every 6 months.         Other   Mixed hyperlipidemia    Not at goal. Would not aggressively treat.       Relevant Medications   pravastatin (PRAVACHOL) 40 MG tablet   Other Relevant Orders   CBC With Diff/Platelet (Completed)   Comprehensive metabolic panel (Completed)   Lipid panel (Completed)  .  Meds ordered this encounter  Medications   Memantine HCl-Donepezil HCl 28-10 MG CP24    Sig: Take 1 capsule by mouth daily.    Dispense:  30 capsule    Refill:  2   pravastatin (PRAVACHOL) 40 MG tablet    Sig: Take 1 tablet (40 mg total) by mouth daily.    Dispense:  90 tablet    Refill:  3    Orders Placed This Encounter  Procedures   CBC With Diff/Platelet   Comprehensive metabolic panel   Lipid panel   Cardiovascular Risk Assessment    Total time spent on today's visit was greater than 30 minutes, including both face-to-face time and nonface-to-face time personally spent on review  of chart (labs and imaging), discussing labs and goals, discussed treatment options, answering patient's questions, and coordinating care.  Follow-up: Return in  about 6 months (around 08/18/2021) for chronic fasting.  An After Visit Summary was printed and given to the patient.  Rochel Brome, MD [facility] [phone]

## 2021-02-19 LAB — CBC WITH DIFF/PLATELET
Basophils Absolute: 0.1 10*3/uL (ref 0.0–0.2)
Basos: 1 %
EOS (ABSOLUTE): 0.1 10*3/uL (ref 0.0–0.4)
Eos: 1 %
Hematocrit: 44.3 % (ref 34.0–46.6)
Hemoglobin: 15.2 g/dL (ref 11.1–15.9)
Immature Grans (Abs): 0 10*3/uL (ref 0.0–0.1)
Immature Granulocytes: 0 %
Lymphocytes Absolute: 3 10*3/uL (ref 0.7–3.1)
Lymphs: 44 %
MCH: 29.7 pg (ref 26.6–33.0)
MCHC: 34.3 g/dL (ref 31.5–35.7)
MCV: 87 fL (ref 79–97)
Monocytes Absolute: 0.8 10*3/uL (ref 0.1–0.9)
Monocytes: 12 %
Neutrophils Absolute: 2.9 10*3/uL (ref 1.4–7.0)
Neutrophils: 42 %
Platelets: 268 10*3/uL (ref 150–450)
RBC: 5.11 x10E6/uL (ref 3.77–5.28)
RDW: 12.7 % (ref 11.7–15.4)
WBC: 6.8 10*3/uL (ref 3.4–10.8)

## 2021-02-19 LAB — COMPREHENSIVE METABOLIC PANEL
ALT: 16 IU/L (ref 0–32)
AST: 20 IU/L (ref 0–40)
Albumin/Globulin Ratio: 2 (ref 1.2–2.2)
Albumin: 4.8 g/dL — ABNORMAL HIGH (ref 3.7–4.7)
Alkaline Phosphatase: 75 IU/L (ref 44–121)
BUN/Creatinine Ratio: 18 (ref 12–28)
BUN: 15 mg/dL (ref 8–27)
Bilirubin Total: 0.4 mg/dL (ref 0.0–1.2)
CO2: 25 mmol/L (ref 20–29)
Calcium: 9.6 mg/dL (ref 8.7–10.3)
Chloride: 101 mmol/L (ref 96–106)
Creatinine, Ser: 0.84 mg/dL (ref 0.57–1.00)
Globulin, Total: 2.4 g/dL (ref 1.5–4.5)
Glucose: 105 mg/dL — ABNORMAL HIGH (ref 70–99)
Potassium: 4.4 mmol/L (ref 3.5–5.2)
Sodium: 140 mmol/L (ref 134–144)
Total Protein: 7.2 g/dL (ref 6.0–8.5)
eGFR: 72 mL/min/{1.73_m2} (ref >59)

## 2021-02-19 LAB — LIPID PANEL
Chol/HDL Ratio: 3.6 ratio (ref 0.0–4.4)
Cholesterol, Total: 225 mg/dL — ABNORMAL HIGH (ref 100–199)
HDL: 63 mg/dL (ref >39)
LDL Chol Calc (NIH): 135 mg/dL — ABNORMAL HIGH (ref 0–99)
Triglycerides: 153 mg/dL — ABNORMAL HIGH (ref 0–149)
VLDL Cholesterol Cal: 27 mg/dL (ref 5–40)

## 2021-02-19 NOTE — Progress Notes (Signed)
Blood count normal.  Liver function normal.  Kidney function normal.  Cholesterol: elevated but stable.  No changes.

## 2021-02-19 NOTE — Assessment & Plan Note (Signed)
Not at goal. Would not aggressively treat.

## 2021-02-19 NOTE — Assessment & Plan Note (Addendum)
Change aricept to namzaric once daily. Educated patient and family.  Recommended caution with driving. Prefer her husband drive.

## 2021-02-19 NOTE — Assessment & Plan Note (Signed)
Order prolia every 6 months.

## 2021-02-20 ENCOUNTER — Other Ambulatory Visit: Payer: Self-pay | Admitting: Family Medicine

## 2021-02-20 DIAGNOSIS — G301 Alzheimer's disease with late onset: Secondary | ICD-10-CM

## 2021-02-20 MED ORDER — MEMANTINE HCL-DONEPEZIL HCL ER 28-10 MG PO CP24: 1.0000 | ORAL_CAPSULE | Freq: Every day | ORAL | 2 refills | Status: DC

## 2021-02-21 ENCOUNTER — Other Ambulatory Visit: Payer: Self-pay | Admitting: Physician Assistant

## 2021-03-03 ENCOUNTER — Telehealth: Payer: Self-pay

## 2021-03-03 NOTE — Telephone Encounter (Signed)
Reached out to patient's husband, Grayland Ormond, to let him know that we have received an approval for the Prolia for [employer].  [employer] Auth# [id number] from 02/28/21 - 02/28/22  Patient scheduled for 03/11/21 at 9am - made aware to call 30 minutes prior to let us know they are on the way.

## 2021-03-11 ENCOUNTER — Other Ambulatory Visit: Payer: Self-pay

## 2021-03-11 ENCOUNTER — Ambulatory Visit (INDEPENDENT_AMBULATORY_CARE_PROVIDER_SITE_OTHER): Payer: Medicare HMO

## 2021-03-11 DIAGNOSIS — M810 Age-related osteoporosis without current pathological fracture: Secondary | ICD-10-CM

## 2021-03-11 MED ORDER — DENOSUMAB 60 MG/ML ~~LOC~~ SOSY
60.0000 mg | PREFILLED_SYRINGE | Freq: Once | SUBCUTANEOUS | Status: AC
  Administered 2021-03-11: 60 mg via SUBCUTANEOUS

## 2021-03-11 NOTE — Progress Notes (Signed)
Kimberly Estrada comes in for a new start of Prolia.   ?

## 2021-03-17 ENCOUNTER — Telehealth: Payer: Self-pay

## 2021-03-17 ENCOUNTER — Other Ambulatory Visit: Payer: Self-pay

## 2021-03-17 MED ORDER — QUETIAPINE FUMARATE 25 MG PO TABS: 25.0000 mg | ORAL_TABLET | Freq: Two times a day (BID) | ORAL | 0 refills | Status: DC

## 2021-03-17 NOTE — Telephone Encounter (Signed)
Called Mr. Ashenfelter.  He approved for the seroquel to be sent to [facility].   ?

## 2021-03-17 NOTE — Telephone Encounter (Signed)
Attempted to call Altha Harm, no answer. Cannot leave VM as VM box is not set up.  ? ?Harrell Lark 03/17/21 4:48 PM ? ?

## 2021-03-17 NOTE — Telephone Encounter (Signed)
General/Other - outbursts ? ?Kimberly Estrada (daughter) calling as patient is having 2-3 outbursts a week. Describes these outbursts as screaming and angry. Typically towards a person but is not violent. This weekend patient became angry with Kimberly Estrada. There was not much noise in room, only noise was Kimberly Estrada and patient's husband speaking. Husband does believe patient will become aggressive eventually. She does have an appointment in May to see neuro but husband feels he cannot last that long without something to help these outbursts. The outburst this weekend is the worst she has had. Kimberly Estrada did mention she is calling as husband is not able to speak about this in front of patient or patient becomes upset.  ? ?Please advise plan. ?Christine: [phone] ? ?Kimberly Estrada 03/17/21 10:05 AM ? ? ? ? ? ?

## 2021-05-21 DIAGNOSIS — Z79899 Other long term (current) drug therapy: Secondary | ICD-10-CM | POA: Diagnosis not present

## 2021-05-21 DIAGNOSIS — R413 Other amnesia: Secondary | ICD-10-CM | POA: Diagnosis not present

## 2021-08-21 ENCOUNTER — Ambulatory Visit (INDEPENDENT_AMBULATORY_CARE_PROVIDER_SITE_OTHER): Payer: Medicare HMO | Admitting: Family Medicine

## 2021-08-21 ENCOUNTER — Encounter: Payer: Self-pay | Admitting: Family Medicine

## 2021-08-21 VITALS — BP 124/56 | HR 60 | Temp 97.4°F | Resp 16 | Ht 60.0 in | Wt 117.0 lb

## 2021-08-21 DIAGNOSIS — Z86010 Personal history of colonic polyps: Secondary | ICD-10-CM

## 2021-08-21 DIAGNOSIS — E782 Mixed hyperlipidemia: Secondary | ICD-10-CM

## 2021-08-21 DIAGNOSIS — G301 Alzheimer's disease with late onset: Secondary | ICD-10-CM | POA: Diagnosis not present

## 2021-08-21 DIAGNOSIS — R911 Solitary pulmonary nodule: Secondary | ICD-10-CM | POA: Diagnosis not present

## 2021-08-21 DIAGNOSIS — Z0000 Encounter for general adult medical examination without abnormal findings: Secondary | ICD-10-CM | POA: Insufficient documentation

## 2021-08-21 DIAGNOSIS — M810 Age-related osteoporosis without current pathological fracture: Secondary | ICD-10-CM | POA: Diagnosis not present

## 2021-08-21 DIAGNOSIS — Z1211 Encounter for screening for malignant neoplasm of colon: Secondary | ICD-10-CM | POA: Diagnosis not present

## 2021-08-21 DIAGNOSIS — F02A18 Dementia in other diseases classified elsewhere, mild, with other behavioral disturbance: Secondary | ICD-10-CM

## 2021-08-21 DIAGNOSIS — R69 Illness, unspecified: Secondary | ICD-10-CM | POA: Diagnosis not present

## 2021-08-21 DIAGNOSIS — Z23 Encounter for immunization: Secondary | ICD-10-CM | POA: Insufficient documentation

## 2021-08-21 MED ORDER — PRAVASTATIN SODIUM 40 MG PO TABS: 40.0000 mg | ORAL_TABLET | Freq: Every day | ORAL | 3 refills | Status: DC

## 2021-08-21 NOTE — Patient Instructions (Signed)
Recommend tetanus (TDAP) and Shingrix vaccines.   Refer to Gastroenterology.

## 2021-08-21 NOTE — Progress Notes (Deleted)
Subjective:  Patient ID: Kimberly Estrada, female    DOB: Nov 12, 1945  Age: 76 y.o. MRN: [id number]  Chief Complaint  Patient presents with   Dementia    HPI   Current Outpatient Medications on File Prior to Visit  Medication Sig Dispense Refill   co-enzyme Q-10 30 MG capsule Take by mouth.     donepezil (ARICEPT) 23 MG TABS tablet Take 23 mg by mouth at bedtime.     meloxicam (MOBIC) 15 MG tablet TAKE 1 TABLET BY MOUTH EVERY DAY 90 tablet 1   omega-3 acid ethyl esters (LOVAZA) 1 g capsule Take 2 capsules (2 g total) by mouth 2 (two) times daily. 360 capsule 0   pravastatin (PRAVACHOL) 40 MG tablet Take 1 tablet (40 mg total) by mouth daily. 90 tablet 3   QUEtiapine (SEROQUEL) 25 MG tablet Take 1 tablet (25 mg total) by mouth in the morning and at bedtime. 30 tablet 0   No current facility-administered medications on file prior to visit.   Past Medical History:  Diagnosis Date   GAD (generalized anxiety disorder)    Mixed hyperlipidemia    Nummular dermatitis    Osteoporosis    Palpitations    Past Surgical History:  Procedure Laterality Date   CATARACT EXTRACTION Bilateral     Family History  Problem Relation Age of Onset   Breast cancer Mother 48   CAD Father    Cancer Sister        colon   Social History   Socioeconomic History   Marital status: Married    Spouse name: Giulliana Mcroberts   Number of children: 3   Years of education: Not on file   Highest education level: Not on file  Occupational History   Not on file  Tobacco Use   Smoking status: Never   Smokeless tobacco: Never  Substance and Sexual Activity   Alcohol use: Never   Drug use: Never   Sexual activity: Yes    Partners: Female  Other Topics Concern   Not on file  Social History Narrative   Not on file   Social Determinants of Health   Financial Resource Strain: Not on file  Food Insecurity: Not on file  Transportation Needs: Not on file  Physical Activity: Not on file  Stress: Not on file   Social Connections: Not on file    Review of Systems   Objective:  BP (!) 124/56   Pulse 60   Temp (!) 97.4 F (36.3 C)   Resp 16   Ht 5' (1.524 m)   Wt 117 lb (53.1 kg)   BMI 22.85 kg/m      08/21/2021    7:51 AM 02/18/2021    8:56 AM 11/12/2020    9:29 AM  BP/Weight  Systolic BP 767 341 937  Diastolic BP 56 60 72  Wt. (Lbs) 117 118.2 119  BMI 22.85 kg/m2 23.08 kg/m2 23.24 kg/m2    Physical Exam  Diabetic Foot Exam - Simple   No data filed      Lab Results  Component Value Date   WBC 6.8 02/18/2021   HGB 15.2 02/18/2021   HCT 44.3 02/18/2021   PLT 268 02/18/2021   GLUCOSE 105 (H) 02/18/2021   CHOL 225 (H) 02/18/2021   TRIG 153 (H) 02/18/2021   HDL 63 02/18/2021   LDLCALC 135 (H) 02/18/2021   ALT 16 02/18/2021   AST 20 02/18/2021   NA 140 02/18/2021   K 4.4 02/18/2021  CL 101 02/18/2021   CREATININE 0.84 02/18/2021   BUN 15 02/18/2021   CO2 25 02/18/2021   TSH 1.480 09/24/2020      Assessment & Plan:   Problem List Items Addressed This Visit       Respiratory   Pulmonary nodule     Nervous and Auditory   Alzheimer's dementia ([place])   Relevant Medications   donepezil (ARICEPT) 23 MG TABS tablet     Musculoskeletal and Integument   Age related osteoporosis     Other   History of colon polyps   Depression   Mixed hyperlipidemia - Primary   Other Visit Diagnoses     Osteoporosis without current pathological fracture, unspecified osteoporosis type         .  No orders of the defined types were placed in this encounter.   No orders of the defined types were placed in this encounter.    Follow-up: No follow-ups on file.  An After Visit Summary was printed and given to the patient.  Rochel Brome, MD [facility] [phone]

## 2021-08-21 NOTE — Progress Notes (Signed)
Subjective:  Patient ID: Kimberly Estrada, female    DOB: [birth year]  Age: 76 y.o. MRN: [id number]  Chief Complaint  Patient presents with   Dementia   Hyperlipidemia    HPI Patient is a female and is  76 years old present today for her 6 Month follow up.  Hyperlipidemia: Current medications: Pravastatin 40 mg daily, Lovaza 1 g 2 capsules twice daily   Memory Loss:  Current Medication:  donepezil 23 mg daily. Patient saw Dr. Everette Rank with neurology. Stopped her namenda and started the aricept.   SDOH Screenings   Alcohol Screen: Low Risk  (08/21/2021)   Alcohol Screen    Last Alcohol Screening Score (AUDIT): 0  Depression (PHQ2-9): Low Risk  (08/21/2021)   Depression (PHQ2-9)    PHQ-2 Score: 0  Financial Resource Strain: Low Risk  (08/21/2021)   Overall Financial Resource Strain (CARDIA)    Difficulty of Paying Living Expenses: Not hard at all  Food Insecurity: No Food Insecurity (08/21/2021)   Hunger Vital Sign    Worried About Running Out of Food in the Last Year: Never true    Ran Out of Food in the Last Year: Never true  Housing: Low Risk  (08/21/2021)   Housing    Last Housing Risk Score: 0  Physical Activity: Insufficiently Active (08/21/2021)   Exercise Vital Sign    Days of Exercise per Week: 3 days    Minutes of Exercise per Session: 30 min  Social Connections: Unknown (08/21/2021)   Social Connection and Isolation Panel [NHANES]    Frequency of Communication with Friends and Family: Three times a week    Frequency of Social Gatherings with Friends and Family: Three times a week    Attends Religious Services: Not on file    Active Member of Clubs or Organizations: Not on file    Attends Club or Organization Meetings: Never    Marital Status: Married  Stress: No Stress Concern Present (08/21/2021)   [place]    Feeling of Stress : Not at all  Tobacco Use: Low Risk  (08/21/2021)   Patient History     Smoking Tobacco Use: Never    Smokeless Tobacco Use: Never    Passive Exposure: Not on file  Transportation Needs: No Transportation Needs (08/21/2021)   PRAPARE - Transportation    Lack of Transportation (Medical): No    Lack of Transportation (Non-Medical): No    Current Outpatient Medications on File Prior to Visit  Medication Sig Dispense Refill   co-enzyme Q-10 30 MG capsule Take by mouth.     donepezil (ARICEPT) 23 MG TABS tablet Take 23 mg by mouth at bedtime.     omega-3 acid ethyl esters (LOVAZA) 1 g capsule Take 2 capsules (2 g total) by mouth 2 (two) times daily. 360 capsule 0   No current facility-administered medications on file prior to visit.   Past Medical History:  Diagnosis Date   GAD (generalized anxiety disorder)    Mixed hyperlipidemia    Nummular dermatitis    Osteoporosis    Palpitations    Past Surgical History:  Procedure Laterality Date   CATARACT EXTRACTION Bilateral     Family History  Problem Relation Age of Onset   Breast cancer Mother 82   CAD Father    Cancer Sister        colon   Social History   Socioeconomic History   Marital status: Married    Spouse  name: Amalya Salmons   Number of children: 3   Years of education: Not on file   Highest education level: Not on file  Occupational History   Not on file  Tobacco Use   Smoking status: Never   Smokeless tobacco: Never  Substance and Sexual Activity   Alcohol use: Never   Drug use: Never   Sexual activity: Yes    Partners: Female  Other Topics Concern   Not on file  Social History Narrative   Not on file   Social Determinants of Health   Financial Resource Strain: Low Risk  (08/21/2021)   Overall Financial Resource Strain (CARDIA)    Difficulty of Paying Living Expenses: Not hard at all  Food Insecurity: No Food Insecurity (08/21/2021)   Hunger Vital Sign    Worried About Running Out of Food in the Last Year: Never true    Ran Out of Food in the Last Year: Never true   Transportation Needs: No Transportation Needs (08/21/2021)   PRAPARE - Hydrologist (Medical): No    Lack of Transportation (Non-Medical): No  Physical Activity: Insufficiently Active (08/21/2021)   Exercise Vital Sign    Days of Exercise per Week: 3 days    Minutes of Exercise per Session: 30 min  Stress: No Stress Concern Present (08/21/2021)   [place]    Feeling of Stress : Not at all  Social Connections: Unknown (08/21/2021)   Social Connection and Isolation Panel [NHANES]    Frequency of Communication with Friends and Family: Three times a week    Frequency of Social Gatherings with Friends and Family: Three times a week    Attends Religious Services: Not on file    Active Member of Clubs or Organizations: Not on file    Attends Club or Organization Meetings: Never    Marital Status: Married    Review of Systems  Constitutional:  Negative for fatigue.  HENT:  Negative for congestion, ear pain and sore throat.   Respiratory:  Negative for cough and shortness of breath.   Cardiovascular:  Negative for chest pain.  Gastrointestinal:  Negative for abdominal pain, constipation, diarrhea, nausea and vomiting.  Genitourinary:  Negative for dysuria, frequency and urgency.  Musculoskeletal:  Negative for arthralgias, back pain and myalgias.  Neurological:  Negative for dizziness and headaches.       Memory changes.  She currently is under the care of Neurology and was started on donepezil 23 mg daily.    Psychiatric/Behavioral:  Negative for agitation and sleep disturbance. The patient is not nervous/anxious.      Objective:  BP (!) 124/56   Pulse 60   Temp (!) 97.4 F (36.3 C)   Resp 16   Ht 5' (1.524 m)   Wt 117 lb (53.1 kg)   BMI 22.85 kg/m      08/21/2021    7:51 AM 02/18/2021    8:56 AM 11/12/2020    9:29 AM  BP/Weight  Systolic BP 440 347 425  Diastolic BP 56 60 72  Wt.  (Lbs) 117 118.2 119  BMI 22.85 kg/m2 23.08 kg/m2 23.24 kg/m2    Physical Exam Vitals reviewed.  Constitutional:      Appearance: Normal appearance. She is normal weight.  Neck:     Vascular: No carotid bruit.  Cardiovascular:     Rate and Rhythm: Normal rate and regular rhythm.     Heart sounds: Normal heart  sounds.  Pulmonary:     Effort: Pulmonary effort is normal. No respiratory distress.     Breath sounds: Normal breath sounds.  Abdominal:     General: Abdomen is flat. Bowel sounds are normal.     Palpations: Abdomen is soft.     Tenderness: There is no abdominal tenderness.  Neurological:     Mental Status: She is alert.  Psychiatric:        Mood and Affect: Mood normal.        Behavior: Behavior normal.    Diabetic Foot Exam - Simple   No data filed      Lab Results  Component Value Date   WBC 6.8 02/18/2021   HGB 15.2 02/18/2021   HCT 44.3 02/18/2021   PLT 268 02/18/2021   GLUCOSE 105 (H) 02/18/2021   CHOL 225 (H) 02/18/2021   TRIG 153 (H) 02/18/2021   HDL 63 02/18/2021   LDLCALC 135 (H) 02/18/2021   ALT 16 02/18/2021   AST 20 02/18/2021   NA 140 02/18/2021   K 4.4 02/18/2021   CL 101 02/18/2021   CREATININE 0.84 02/18/2021   BUN 15 02/18/2021   CO2 25 02/18/2021   TSH 1.480 09/24/2020      Assessment & Plan:   Problem List Items Addressed This Visit       Respiratory   Pulmonary nodule     Nervous and Auditory   Alzheimer's dementia ([place])   Relevant Medications   donepezil (ARICEPT) 23 MG TABS tablet     Musculoskeletal and Integument   Age related osteoporosis   Osteoporosis without current pathological fracture     Other   History of colon polyps   Relevant Orders   Ambulatory referral to Gastroenterology   Mixed hyperlipidemia - Primary   Relevant Medications   pravastatin (PRAVACHOL) 40 MG tablet   Other Relevant Orders   CBC with Differential/Platelet   Comprehensive metabolic panel   Lipid panel   Screen for colon  cancer   Relevant Orders   Ambulatory referral to Gastroenterology  .  Meds ordered this encounter  Medications   pravastatin (PRAVACHOL) 40 MG tablet    Sig: Take 1 tablet (40 mg total) by mouth daily.    Dispense:  90 tablet    Refill:  3    Orders Placed This Encounter  Procedures   CBC with Differential/Platelet   Comprehensive metabolic panel   Lipid panel   Ambulatory referral to Gastroenterology    Total time spent on today's visit was greater than 30 minutes, including both face-to-face time and nonface-to-face time personally spent on review of chart (labs and imaging), discussing labs and goals, discussing further work-up, treatment options, referrals to specialist if needed, reviewing outside records of pertinent, answering patient's questions, and coordinating care.  Follow-up: Return in about 6 months (around 02/21/2022) for awv, chronic fasting.  An After Visit Summary was printed and given to the patient.  Rochel Brome, MD [facility] [phone]

## 2021-08-22 LAB — COMPREHENSIVE METABOLIC PANEL
ALT: 16 IU/L (ref 0–32)
AST: 18 IU/L (ref 0–40)
Albumin/Globulin Ratio: 1.8 (ref 1.2–2.2)
Albumin: 4.4 g/dL (ref 3.8–4.8)
Alkaline Phosphatase: 57 IU/L (ref 44–121)
BUN/Creatinine Ratio: 15 (ref 12–28)
BUN: 11 mg/dL (ref 8–27)
Bilirubin Total: 0.4 mg/dL (ref 0.0–1.2)
CO2: 23 mmol/L (ref 20–29)
Calcium: 9.8 mg/dL (ref 8.7–10.3)
Chloride: 104 mmol/L (ref 96–106)
Creatinine, Ser: 0.73 mg/dL (ref 0.57–1.00)
Globulin, Total: 2.4 g/dL (ref 1.5–4.5)
Glucose: 99 mg/dL (ref 70–99)
Potassium: 4.5 mmol/L (ref 3.5–5.2)
Sodium: 141 mmol/L (ref 134–144)
Total Protein: 6.8 g/dL (ref 6.0–8.5)
eGFR: 86 mL/min/{1.73_m2} (ref >59)

## 2021-08-22 LAB — CBC WITH DIFFERENTIAL/PLATELET
Basophils Absolute: 0.1 10*3/uL (ref 0.0–0.2)
Basos: 1 %
EOS (ABSOLUTE): 0.2 10*3/uL (ref 0.0–0.4)
Eos: 2 %
Hematocrit: 41.7 % (ref 34.0–46.6)
Hemoglobin: 14.3 g/dL (ref 11.1–15.9)
Immature Grans (Abs): 0 10*3/uL (ref 0.0–0.1)
Immature Granulocytes: 0 %
Lymphocytes Absolute: 3.7 10*3/uL — ABNORMAL HIGH (ref 0.7–3.1)
Lymphs: 46 %
MCH: 30.2 pg (ref 26.6–33.0)
MCHC: 34.3 g/dL (ref 31.5–35.7)
MCV: 88 fL (ref 79–97)
Monocytes Absolute: 1 10*3/uL — ABNORMAL HIGH (ref 0.1–0.9)
Monocytes: 13 %
Neutrophils Absolute: 2.9 10*3/uL (ref 1.4–7.0)
Neutrophils: 38 %
Platelets: 257 10*3/uL (ref 150–450)
RBC: 4.74 x10E6/uL (ref 3.77–5.28)
RDW: 12.2 % (ref 11.7–15.4)
WBC: 7.8 10*3/uL (ref 3.4–10.8)

## 2021-08-22 LAB — LIPID PANEL
Chol/HDL Ratio: 3.3 ratio (ref 0.0–4.4)
Cholesterol, Total: 194 mg/dL (ref 100–199)
HDL: 58 mg/dL (ref >39)
LDL Chol Calc (NIH): 111 mg/dL — ABNORMAL HIGH (ref 0–99)
Triglycerides: 140 mg/dL (ref 0–149)
VLDL Cholesterol Cal: 25 mg/dL (ref 5–40)

## 2021-08-22 LAB — CARDIOVASCULAR RISK ASSESSMENT

## 2021-08-22 NOTE — Progress Notes (Signed)
Blood count normal.  Liver function normal.  Kidney function normal.  Cholesterol: Improved.

## 2021-10-07 ENCOUNTER — Ambulatory Visit (INDEPENDENT_AMBULATORY_CARE_PROVIDER_SITE_OTHER): Payer: Medicare HMO

## 2021-10-07 DIAGNOSIS — M810 Age-related osteoporosis without current pathological fracture: Secondary | ICD-10-CM | POA: Diagnosis not present

## 2021-10-07 MED ORDER — DENOSUMAB 60 MG/ML ~~LOC~~ SOSY
60.0000 mg | PREFILLED_SYRINGE | Freq: Once | SUBCUTANEOUS | Status: AC
  Administered 2021-10-07: 60 mg via SUBCUTANEOUS

## 2021-11-08 DIAGNOSIS — R69 Illness, unspecified: Secondary | ICD-10-CM | POA: Diagnosis not present

## 2021-11-08 DIAGNOSIS — I609 Nontraumatic subarachnoid hemorrhage, unspecified: Secondary | ICD-10-CM | POA: Diagnosis not present

## 2021-11-08 DIAGNOSIS — Z79899 Other long term (current) drug therapy: Secondary | ICD-10-CM | POA: Diagnosis not present

## 2021-11-08 DIAGNOSIS — S199XXA Unspecified injury of neck, initial encounter: Secondary | ICD-10-CM | POA: Diagnosis not present

## 2021-11-08 DIAGNOSIS — I10 Essential (primary) hypertension: Secondary | ICD-10-CM | POA: Diagnosis not present

## 2021-11-08 DIAGNOSIS — G9389 Other specified disorders of brain: Secondary | ICD-10-CM | POA: Diagnosis not present

## 2021-11-08 DIAGNOSIS — Z743 Need for continuous supervision: Secondary | ICD-10-CM | POA: Diagnosis not present

## 2021-11-08 DIAGNOSIS — R079 Chest pain, unspecified: Secondary | ICD-10-CM | POA: Diagnosis not present

## 2021-11-08 DIAGNOSIS — I959 Hypotension, unspecified: Secondary | ICD-10-CM | POA: Diagnosis not present

## 2021-11-08 DIAGNOSIS — S0003XA Contusion of scalp, initial encounter: Secondary | ICD-10-CM | POA: Diagnosis not present

## 2021-11-08 DIAGNOSIS — R220 Localized swelling, mass and lump, head: Secondary | ICD-10-CM | POA: Diagnosis not present

## 2021-11-08 DIAGNOSIS — G319 Degenerative disease of nervous system, unspecified: Secondary | ICD-10-CM | POA: Diagnosis not present

## 2021-11-08 DIAGNOSIS — R109 Unspecified abdominal pain: Secondary | ICD-10-CM | POA: Diagnosis not present

## 2021-11-08 DIAGNOSIS — R001 Bradycardia, unspecified: Secondary | ICD-10-CM | POA: Diagnosis not present

## 2021-11-08 DIAGNOSIS — S0101XA Laceration without foreign body of scalp, initial encounter: Secondary | ICD-10-CM | POA: Diagnosis not present

## 2021-11-08 DIAGNOSIS — S0191XA Laceration without foreign body of unspecified part of head, initial encounter: Secondary | ICD-10-CM | POA: Diagnosis not present

## 2021-11-08 DIAGNOSIS — Y92009 Unspecified place in unspecified non-institutional (private) residence as the place of occurrence of the external cause: Secondary | ICD-10-CM | POA: Diagnosis not present

## 2021-11-08 DIAGNOSIS — I6782 Cerebral ischemia: Secondary | ICD-10-CM | POA: Diagnosis not present

## 2021-11-08 DIAGNOSIS — G40909 Epilepsy, unspecified, not intractable, without status epilepticus: Secondary | ICD-10-CM | POA: Diagnosis not present

## 2021-11-08 DIAGNOSIS — S066X0A Traumatic subarachnoid hemorrhage without loss of consciousness, initial encounter: Secondary | ICD-10-CM | POA: Diagnosis not present

## 2021-11-08 DIAGNOSIS — W19XXXA Unspecified fall, initial encounter: Secondary | ICD-10-CM | POA: Diagnosis not present

## 2021-11-10 DIAGNOSIS — Z4800 Encounter for change or removal of nonsurgical wound dressing: Secondary | ICD-10-CM | POA: Diagnosis not present

## 2021-11-10 DIAGNOSIS — S0101XD Laceration without foreign body of scalp, subsequent encounter: Secondary | ICD-10-CM | POA: Diagnosis not present

## 2021-11-19 DIAGNOSIS — R55 Syncope and collapse: Secondary | ICD-10-CM | POA: Insufficient documentation

## 2021-11-19 DIAGNOSIS — Z8673 Personal history of transient ischemic attack (TIA), and cerebral infarction without residual deficits: Secondary | ICD-10-CM | POA: Diagnosis not present

## 2021-11-19 DIAGNOSIS — I609 Nontraumatic subarachnoid hemorrhage, unspecified: Secondary | ICD-10-CM | POA: Insufficient documentation

## 2021-11-19 DIAGNOSIS — R413 Other amnesia: Secondary | ICD-10-CM | POA: Diagnosis not present

## 2021-12-01 DIAGNOSIS — D128 Benign neoplasm of rectum: Secondary | ICD-10-CM | POA: Diagnosis not present

## 2021-12-01 DIAGNOSIS — K641 Second degree hemorrhoids: Secondary | ICD-10-CM | POA: Diagnosis not present

## 2021-12-01 DIAGNOSIS — Z1211 Encounter for screening for malignant neoplasm of colon: Secondary | ICD-10-CM | POA: Diagnosis not present

## 2021-12-01 DIAGNOSIS — Z86010 Personal history of colonic polyps: Secondary | ICD-10-CM | POA: Diagnosis not present

## 2021-12-01 DIAGNOSIS — K5730 Diverticulosis of large intestine without perforation or abscess without bleeding: Secondary | ICD-10-CM | POA: Diagnosis not present

## 2021-12-01 DIAGNOSIS — K621 Rectal polyp: Secondary | ICD-10-CM | POA: Diagnosis not present

## 2021-12-01 LAB — HM COLONOSCOPY

## 2021-12-02 ENCOUNTER — Encounter: Payer: Self-pay | Admitting: Family Medicine

## 2021-12-08 DIAGNOSIS — R413 Other amnesia: Secondary | ICD-10-CM | POA: Diagnosis not present

## 2021-12-08 DIAGNOSIS — Z8679 Personal history of other diseases of the circulatory system: Secondary | ICD-10-CM | POA: Diagnosis not present

## 2021-12-12 DIAGNOSIS — J320 Chronic maxillary sinusitis: Secondary | ICD-10-CM | POA: Diagnosis not present

## 2021-12-12 DIAGNOSIS — Z8679 Personal history of other diseases of the circulatory system: Secondary | ICD-10-CM | POA: Diagnosis not present

## 2021-12-12 DIAGNOSIS — I609 Nontraumatic subarachnoid hemorrhage, unspecified: Secondary | ICD-10-CM | POA: Diagnosis not present

## 2021-12-12 DIAGNOSIS — S066X0A Traumatic subarachnoid hemorrhage without loss of consciousness, initial encounter: Secondary | ICD-10-CM | POA: Diagnosis not present

## 2021-12-22 DIAGNOSIS — R569 Unspecified convulsions: Secondary | ICD-10-CM | POA: Diagnosis not present

## 2022-01-01 DIAGNOSIS — R55 Syncope and collapse: Secondary | ICD-10-CM | POA: Diagnosis not present

## 2022-01-02 DIAGNOSIS — R55 Syncope and collapse: Secondary | ICD-10-CM | POA: Diagnosis not present

## 2022-01-09 ENCOUNTER — Other Ambulatory Visit: Payer: Self-pay | Admitting: Family Medicine

## 2022-01-09 DIAGNOSIS — Z1231 Encounter for screening mammogram for malignant neoplasm of breast: Secondary | ICD-10-CM

## 2022-01-13 DIAGNOSIS — R55 Syncope and collapse: Secondary | ICD-10-CM | POA: Diagnosis not present

## 2022-02-22 NOTE — Progress Notes (Unsigned)
Subjective:   Kimberly Estrada is a 77 y.o. female who presents for Medicare Annual (Subsequent) preventive examination.  Hyperlipidemia: Current medications: Pravastatin 40 mg daily, Lovaza 1 g 2 capsules twice daily   Memory Loss:  Current Medication:  donepezil 23 mg daily. Patient saw Dr. Everette Rank with neurology. Stopped her namenda and started the aricept.   Review of Systems    Review of Systems  Constitutional:  Negative for chills, fever and malaise/fatigue.  HENT:  Negative for ear pain, sinus pain and sore throat.   Respiratory:  Negative for cough and shortness of breath.   Cardiovascular:  Negative for chest pain.  Musculoskeletal:  Negative for myalgias.  Neurological:  Negative for headaches.       Objective:    Today's Vitals   02/23/22 0818  BP: 122/76  Pulse: 72  Temp: (!) 97.2 F (36.2 C)  SpO2: 95%  Weight: 120 lb (54.4 kg)  Height: 5' 1.5" (1.562 m)   Body mass index is 22.31 kg/m.  Physical Exam Vitals reviewed.  Constitutional:      Appearance: Normal appearance. She is normal weight.  Neck:     Vascular: No carotid bruit.  Cardiovascular:     Rate and Rhythm: Normal rate and regular rhythm.     Heart sounds: Normal heart sounds.  Pulmonary:     Effort: Pulmonary effort is normal. No respiratory distress.     Breath sounds: Normal breath sounds.  Abdominal:     General: Abdomen is flat. Bowel sounds are normal.     Palpations: Abdomen is soft.     Tenderness: There is no abdominal tenderness.  Neurological:     Mental Status: She is alert and oriented to person, place, and time.  Psychiatric:        Mood and Affect: Mood normal.        Behavior: Behavior normal.         02/23/2022    8:23 AM 09/24/2020    8:14 AM  Advanced Directives  Does Patient Have a Medical Advance Directive? Yes No  Type of Paramedic of [address];Living will   Does patient want to make changes to medical advance directive? Yes (ED -  Information included in AVS)   Would patient like information on creating a medical advance directive?  Yes (ED - Information included in AVS)    Current Medications (verified) Outpatient Encounter Medications as of 02/23/2022  Medication Sig   co-enzyme Q-10 30 MG capsule Take by mouth.   donepezil (ARICEPT) 23 MG TABS tablet Take 23 mg by mouth at bedtime.   omega-3 acid ethyl esters (LOVAZA) 1 g capsule Take 2 capsules (2 g total) by mouth 2 (two) times daily.   pravastatin (PRAVACHOL) 40 MG tablet Take 1 tablet (40 mg total) by mouth daily.   No facility-administered encounter medications on file as of 02/23/2022.    Allergies (verified) Patient has no known allergies.   History: Past Medical History:  Diagnosis Date   GAD (generalized anxiety disorder)    Mixed hyperlipidemia    Nummular dermatitis    Osteoporosis    Palpitations    Past Surgical History:  Procedure Laterality Date   CATARACT EXTRACTION Bilateral    Family History  Problem Relation Age of Onset   Breast cancer Mother 43   CAD Father    Cancer Sister        colon   Social History   Socioeconomic History   Marital status:  Married    Spouse name: Ivanka Coleson   Number of children: 3   Years of education: Not on file   Highest education level: Not on file  Occupational History   Not on file  Tobacco Use   Smoking status: Never   Smokeless tobacco: Never  Substance and Sexual Activity   Alcohol use: Never   Drug use: Never   Sexual activity: Yes    Partners: Female  Other Topics Concern   Not on file  Social History Narrative   Not on file   Social Determinants of Health   Financial Resource Strain: Low Risk  (08/21/2021)   Overall Financial Resource Strain (CARDIA)    Difficulty of Paying Living Expenses: Not hard at all  Food Insecurity: No Food Insecurity (08/21/2021)   Hunger Vital Sign    Worried About Running Out of Food in the Last Year: Never true    Ran Out of Food in the Last  Year: Never true  Transportation Needs: No Transportation Needs (08/21/2021)   PRAPARE - Hydrologist (Medical): No    Lack of Transportation (Non-Medical): No  Physical Activity: Insufficiently Active (08/21/2021)   Exercise Vital Sign    Days of Exercise per Week: 3 days    Minutes of Exercise per Session: 30 min  Stress: No Stress Concern Present (08/21/2021)   [place]    Feeling of Stress : Not at all  Social Connections: Moderately Integrated (02/23/2022)   Social Connection and Isolation Panel [NHANES]    Frequency of Communication with Friends and Family: Three times a week    Frequency of Social Gatherings with Friends and Family: Three times a week    Attends Religious Services: More than 4 times per year    Active Member of Clubs or Organizations: No    Attends Archivist Meetings: Never    Marital Status: Married    Tobacco Counseling Counseling given: Not Answered   Clinical Intake:     Pain : No/denies pain     Diabetes: No  How often do you need to have someone help you when you read instructions, pamphlets, or other written materials from your doctor or pharmacy?: 1 - Never  Diabetic? No    Activities of Daily Living    02/23/2022    8:22 AM 08/21/2021    8:30 AM  In your present state of health, do you have any difficulty performing the following activities:  Hearing? 0 1  Comment wears hearing aid   Vision?  0  Difficulty concentrating or making decisions? 1 1  Walking or climbing stairs? 0 0  Dressing or bathing? 0 0  Doing errands, shopping? 1 0  Preparing Food and eating ? N N  Using the Toilet? N N  In the past six months, have you accidently leaked urine? N N  Do you have problems with loss of bowel control? N N  Managing your Medications? N Y  Comment  she will not admit it, but her husband does her medicines.  Managing your Finances?  N N  Housekeeping or managing your Housekeeping? N N    Patient Care Team: CoxElnita Maxwell, MD as PCP - General (Family Medicine)  Indicate any recent Medical Services you may have received from other than [facility] providers in the past year (date may be approximate).     Assessment:   This is a routine wellness examination for Kimberly Estrada.  Hearing/Vision screen No results found.  Dietary issues and exercise activities discussed:     Goals Addressed   None   Depression Screen    02/23/2022    8:22 AM 08/21/2021    7:56 AM 09/24/2020    8:14 AM  PHQ 2/9 Scores  PHQ - 2 Score 0 0 0    Fall Risk    02/23/2022    8:23 AM 08/21/2021    7:56 AM 09/24/2020    8:14 AM  Fall Risk   Falls in the past year? 1 0 0  Comment 11/2021    Number falls in past yr: 0 0 0  Injury with Fall? 1 0 0  Risk for fall due to : No Fall Risks No Fall Risks No Fall Risks  Follow up Falls evaluation completed Falls evaluation completed Falls evaluation completed    [place]:  Any stairs in or around the home? Yes  If so, are there any without handrails? Yes  Home free of loose throw rugs in walkways, pet beds, electrical cords, etc? No  Adequate lighting in your home to reduce risk of falls? Yes   ASSISTIVE DEVICES UTILIZED TO PREVENT FALLS:  Life alert? No  Use of a cane, walker or w/c? No  Grab bars in the bathroom? Yes  Shower chair or bench in shower? Yes  Elevated toilet seat or a handicapped toilet? Yes   TIMED UP AND GO:  Was the test performed? Yes .  Length of time to ambulate 10 feet: 10 sec.   Gait steady and fast without use of assistive device  Cognitive Function:    02/23/2022    8:43 AM 11/12/2020   10:35 AM  MMSE - Mini Mental State Exam  Not completed: Refused   Orientation to time  3  Orientation to Place  3  Registration  3  Attention/ Calculation  4  Recall  0  Language- name 2 objects  2  Language- repeat  1  Language- follow 3  step command  3  Language- read & follow direction  1  Write a sentence  1  Copy design  1  Total score  22        09/24/2020    9:01 AM  6CIT Screen  What Year? 4 points  What month? 0 points  What time? 3 points  Count back from 20 0 points  Months in reverse 0 points  Repeat phrase 10 points  Total Score 17 points    Immunizations Immunization History  Administered Date(s) Administered   Fluad Quad(high Dose 65+) 10/26/2019, 09/24/2020   Influenza-Unspecified 09/23/2018   Moderna Covid-19 Vaccine Bivalent Booster 70yr & up 02/18/2021   Moderna Sars-Covid-2 Vaccination 02/03/2019, 03/01/2019, 11/29/2019   Pneumococcal Conjugate-13 06/12/2015   Pneumococcal Polysaccharide-23 09/07/2017    TDAP status: Due, Education has been provided regarding the importance of this vaccine. Advised may receive this vaccine at local pharmacy or Health Dept. Aware to provide a copy of the vaccination record if obtained from local pharmacy or Health Dept. Verbalized acceptance and understanding.  Flu Vaccine status: Declined, Education has been provided regarding the importance of this vaccine but patient still declined. Advised may receive this vaccine at local pharmacy or Health Dept. Aware to provide a copy of the vaccination record if obtained from local pharmacy or Health Dept. Verbalized acceptance and understanding.  Pneumococcal vaccine status: Up to date  Covid-19 vaccine status: Information provided on how to obtain  vaccines.   Qualifies for Shingles Vaccine? Yes   Zostavax completed No   Shingrix Completed?: No.    Education has been provided regarding the importance of this vaccine. Patient has been advised to call insurance company to determine out of pocket expense if they have not yet received this vaccine. Advised may also receive vaccine at local pharmacy or Health Dept. Verbalized acceptance and understanding.  Screening Tests Health Maintenance  Topic Date Due    Hepatitis C Screening  Never done   DTaP/Tdap/Td (1 - Tdap) Never done   Zoster Vaccines- Shingrix (1 of 2) Never done   COVID-19 Vaccine (5 - 2023-24 season) 09/05/2021   INFLUENZA VACCINE  04/05/2022 (Originally 08/05/2021)   Medicare Annual Wellness ([place])  02/24/2023   COLONOSCOPY (Pts 45-34yr Insurance coverage will need to be confirmed)  12/02/2026   Pneumonia Vaccine 77 Years old  Completed   DEXA SCAN  Completed   HPV VACCINES  Aged Out    Health Maintenance  Health Maintenance Due  Topic Date Due   Hepatitis C Screening  Never done   DTaP/Tdap/Td (1 - Tdap) Never done   Zoster Vaccines- Shingrix (1 of 2) Never done   COVID-19 Vaccine (5 - 2023-24 season) 09/05/2021    Colorectal cancer screening: Type of screening: Colonoscopy. Completed 12/01/2021. Repeat every 5 years  Mammogram status: Scheduled for next tuesday  Bone Density status: Completed 11/2020.  Lung Cancer Screening: (Low Dose CT Chest recommended if Age 77-80years, 30 pack-year currently smoking OR have quit w/in 15years.) does not qualify.   Lung Cancer Screening Referral: N/A  Additional Screening:  Hepatitis C Screening: does not qualify;  Vision Screening: Recommended annual ophthalmology exams for early detection of glaucoma and other disorders of the eye. Is the patient up to date with their annual eye exam?  Yes  Who is the provider or what is the name of the office in which the patient attends annual eye exams?  If pt is not established with a provider, would they like to be referred to a provider to establish care? No .   Dental Screening: Recommended annual dental exams for proper oral hygiene  Community Resource Referral / Chronic Care Management: CRR required this visit?  No   CCM required this visit?  No      Plan:     I have personally reviewed and noted the following in the patient's chart:   Medical and social history Use of alcohol, tobacco or illicit drugs  Current  medications and supplements including opioid prescriptions. Patient is not currently taking opioid prescriptions. Functional ability and status Nutritional status Physical activity Advanced directives List of other physicians Hospitalizations, surgeries, and ER visits in previous 12 months Vitals Screenings to include cognitive, depression, and falls Referrals and appointments  In addition, I have reviewed and discussed with patient certain preventive protocols, quality metrics, and best practice recommendations. A written personalized care plan for preventive services as well as general preventive health recommendations were provided to patient.    I attest that I have reviewed this visit and agree with the plan scribed by my staff.   KRochel Brome MD [facility] ([phone]

## 2022-02-22 NOTE — Patient Instructions (Signed)
Speak with Pharmacist about Tdap, Shingrix, Covid and RSV vaccine.  Health Maintenance, Female Adopting a healthy lifestyle and getting preventive care are important in promoting health and wellness. Ask your health care provider about: The right schedule for you to have regular tests and exams. Things you can do on your own to prevent diseases and keep yourself healthy. What should I know about diet, weight, and exercise? Eat a healthy diet  Eat a diet that includes plenty of vegetables, fruits, low-fat dairy products, and lean protein. Do not eat a lot of foods that are high in solid fats, added sugars, or sodium. Maintain a healthy weight Body mass index (BMI) is used to identify weight problems. It estimates body fat based on height and weight. Your health care provider can help determine your BMI and help you achieve or maintain a healthy weight. Get regular exercise Get regular exercise. This is one of the most important things you can do for your health. Most adults should: Exercise for at least 150 minutes each week. The exercise should increase your heart rate and make you sweat (moderate-intensity exercise). Do strengthening exercises at least twice a week. This is in addition to the moderate-intensity exercise. Spend less time sitting. Even light physical activity can be beneficial. Watch cholesterol and blood lipids Have your blood tested for lipids and cholesterol at 77 years of age, then have this test every 5 years. Have your cholesterol levels checked more often if: Your lipid or cholesterol levels are high. You are older than 77 years of age. You are at high risk for heart disease. What should I know about cancer screening? Depending on your health history and family history, you may need to have cancer screening at various ages. This may include screening for: Breast cancer. Cervical cancer. Colorectal cancer. Skin cancer. Lung cancer. What should I know about heart  disease, diabetes, and high blood pressure? Blood pressure and heart disease High blood pressure causes heart disease and increases the risk of stroke. This is more likely to develop in people who have high blood pressure readings or are overweight. Have your blood pressure checked: Every 3-5 years if you are 71-86 years of age. Every year if you are 51 years old or older. Diabetes Have regular diabetes screenings. This checks your fasting blood sugar level. Have the screening done: Once every three years after age 22 if you are at a normal weight and have a low risk for diabetes. More often and at a younger age if you are overweight or have a high risk for diabetes. What should I know about preventing infection? Hepatitis B If you have a higher risk for hepatitis B, you should be screened for this virus. Talk with your health care provider to find out if you are at risk for hepatitis B infection. Hepatitis C Testing is recommended for: Everyone born from [birth year] through 1965. Anyone with known risk factors for hepatitis C. Sexually transmitted infections (STIs) Get screened for STIs, including gonorrhea and chlamydia, if: You are sexually active and are younger than 77 years of age. You are older than 77 years of age and your health care provider tells you that you are at risk for this type of infection. Your sexual activity has changed since you were last screened, and you are at increased risk for chlamydia or gonorrhea. Ask your health care provider if you are at risk. Ask your health care provider about whether you are at high risk for HIV. Your health  care provider may recommend a prescription medicine to help prevent HIV infection. If you choose to take medicine to prevent HIV, you should first get tested for HIV. You should then be tested every 3 months for as long as you are taking the medicine. Pregnancy If you are about to stop having your period (premenopausal) and you may become  pregnant, seek counseling before you get pregnant. Take 400 to 800 micrograms (mcg) of folic acid every day if you become pregnant. Ask for birth control (contraception) if you want to prevent pregnancy. Osteoporosis and menopause Osteoporosis is a disease in which the bones lose minerals and strength with aging. This can result in bone fractures. If you are 75 years old or older, or if you are at risk for osteoporosis and fractures, ask your health care provider if you should: Be screened for bone loss. Take a calcium or vitamin D supplement to lower your risk of fractures. Be given hormone replacement therapy (HRT) to treat symptoms of menopause. Follow these instructions at home: Alcohol use Do not drink alcohol if: Your health care provider tells you not to drink. You are pregnant, may be pregnant, or are planning to become pregnant. If you drink alcohol: Limit how much you have to: 0-1 drink a day. Know how much alcohol is in your drink. In the [place] one drink equals one 12 oz bottle of beer (355 mL), one 5 oz glass of wine (148 mL), or one 1 oz glass of hard liquor (44 mL). Lifestyle Do not use any products that contain nicotine or tobacco. These products include cigarettes, chewing tobacco, and vaping devices, such as e-cigarettes. If you need help quitting, ask your health care provider. Do not use street drugs. Do not share needles. Ask your health care provider for help if you need support or information about quitting drugs. General instructions Schedule regular health, dental, and eye exams. Stay current with your vaccines. Tell your health care provider if: You often feel depressed. You have ever been abused or do not feel safe at home. Summary Adopting a healthy lifestyle and getting preventive care are important in promoting health and wellness. Follow your health care provider's instructions about healthy diet, exercising, and getting tested or screened for  diseases. Follow your health care provider's instructions on monitoring your cholesterol and blood pressure. This information is not intended to replace advice given to you by your health care provider. Make sure you discuss any questions you have with your health care provider. Document Revised: 05/13/2020 Document Reviewed: 05/13/2020 Elsevier Patient Education  Kimberly Estrada.

## 2022-02-23 ENCOUNTER — Encounter: Payer: Self-pay | Admitting: Family Medicine

## 2022-02-23 ENCOUNTER — Ambulatory Visit (INDEPENDENT_AMBULATORY_CARE_PROVIDER_SITE_OTHER): Payer: Medicare HMO | Admitting: Family Medicine

## 2022-02-23 VITALS — BP 122/76 | HR 72 | Temp 97.2°F | Ht 61.5 in | Wt 120.0 lb

## 2022-02-23 DIAGNOSIS — Z0000 Encounter for general adult medical examination without abnormal findings: Secondary | ICD-10-CM | POA: Diagnosis not present

## 2022-02-23 DIAGNOSIS — E782 Mixed hyperlipidemia: Secondary | ICD-10-CM | POA: Diagnosis not present

## 2022-02-24 ENCOUNTER — Other Ambulatory Visit: Payer: Self-pay

## 2022-02-24 LAB — COMPREHENSIVE METABOLIC PANEL
ALT: 14 IU/L (ref 0–32)
AST: 17 IU/L (ref 0–40)
Albumin/Globulin Ratio: 1.7 (ref 1.2–2.2)
Albumin: 4.3 g/dL (ref 3.8–4.8)
Alkaline Phosphatase: 58 IU/L (ref 44–121)
BUN/Creatinine Ratio: 15 (ref 12–28)
BUN: 10 mg/dL (ref 8–27)
Bilirubin Total: 0.4 mg/dL (ref 0.0–1.2)
CO2: 22 mmol/L (ref 20–29)
Calcium: 9.3 mg/dL (ref 8.7–10.3)
Chloride: 102 mmol/L (ref 96–106)
Creatinine, Ser: 0.66 mg/dL (ref 0.57–1.00)
Globulin, Total: 2.6 g/dL (ref 1.5–4.5)
Glucose: 96 mg/dL (ref 70–99)
Potassium: 4.5 mmol/L (ref 3.5–5.2)
Sodium: 139 mmol/L (ref 134–144)
Total Protein: 6.9 g/dL (ref 6.0–8.5)
eGFR: 91 mL/min/{1.73_m2} (ref >59)

## 2022-02-24 LAB — LIPID PANEL
Chol/HDL Ratio: 3.8 ratio (ref 0.0–4.4)
Cholesterol, Total: 222 mg/dL — ABNORMAL HIGH (ref 100–199)
HDL: 59 mg/dL (ref >39)
LDL Chol Calc (NIH): 120 mg/dL — ABNORMAL HIGH (ref 0–99)
Triglycerides: 248 mg/dL — ABNORMAL HIGH (ref 0–149)
VLDL Cholesterol Cal: 43 mg/dL — ABNORMAL HIGH (ref 5–40)

## 2022-02-24 LAB — CBC WITH DIFFERENTIAL/PLATELET
Basophils Absolute: 0.1 10*3/uL (ref 0.0–0.2)
Basos: 1 %
EOS (ABSOLUTE): 0.1 10*3/uL (ref 0.0–0.4)
Eos: 2 %
Hematocrit: 44.6 % (ref 34.0–46.6)
Hemoglobin: 15.1 g/dL (ref 11.1–15.9)
Immature Grans (Abs): 0 10*3/uL (ref 0.0–0.1)
Immature Granulocytes: 0 %
Lymphocytes Absolute: 3 10*3/uL (ref 0.7–3.1)
Lymphs: 44 %
MCH: 29.2 pg (ref 26.6–33.0)
MCHC: 33.9 g/dL (ref 31.5–35.7)
MCV: 86 fL (ref 79–97)
Monocytes Absolute: 0.9 10*3/uL (ref 0.1–0.9)
Monocytes: 13 %
Neutrophils Absolute: 2.7 10*3/uL (ref 1.4–7.0)
Neutrophils: 40 %
Platelets: 254 10*3/uL (ref 150–450)
RBC: 5.17 x10E6/uL (ref 3.77–5.28)
RDW: 12.6 % (ref 11.7–15.4)
WBC: 6.8 10*3/uL (ref 3.4–10.8)

## 2022-02-24 LAB — CARDIOVASCULAR RISK ASSESSMENT

## 2022-02-24 MED ORDER — OMEGA-3-ACID ETHYL ESTERS 1 G PO CAPS: 2.0000 g | ORAL_CAPSULE | Freq: Two times a day (BID) | ORAL | 2 refills | Status: DC

## 2022-02-24 NOTE — Progress Notes (Signed)
Blood count normal.  Liver function normal.  Kidney function normal.  Cholesterol: Very high. Trigs 248, LDL 120. I am concerned patient is not taking her lovaza. I have not filled it in many months. She needs to restart.

## 2022-03-03 ENCOUNTER — Ambulatory Visit

## 2022-03-26 DIAGNOSIS — H1045 Other chronic allergic conjunctivitis: Secondary | ICD-10-CM | POA: Diagnosis not present

## 2022-03-26 DIAGNOSIS — H1032 Unspecified acute conjunctivitis, left eye: Secondary | ICD-10-CM | POA: Diagnosis not present

## 2022-04-09 ENCOUNTER — Ambulatory Visit

## 2022-04-17 ENCOUNTER — Ambulatory Visit

## 2022-04-28 ENCOUNTER — Telehealth: Payer: Self-pay

## 2022-04-28 ENCOUNTER — Ambulatory Visit (INDEPENDENT_AMBULATORY_CARE_PROVIDER_SITE_OTHER): Payer: Medicare HMO

## 2022-04-28 DIAGNOSIS — Z23 Encounter for immunization: Secondary | ICD-10-CM | POA: Diagnosis not present

## 2022-04-28 DIAGNOSIS — Z2914 Encounter for prophylactic rabies immune globin: Secondary | ICD-10-CM | POA: Diagnosis not present

## 2022-04-28 DIAGNOSIS — M810 Age-related osteoporosis without current pathological fracture: Secondary | ICD-10-CM

## 2022-04-28 DIAGNOSIS — Z203 Contact with and (suspected) exposure to rabies: Secondary | ICD-10-CM | POA: Diagnosis not present

## 2022-04-28 DIAGNOSIS — W5501XA Bitten by cat, initial encounter: Secondary | ICD-10-CM | POA: Diagnosis not present

## 2022-04-28 DIAGNOSIS — S61451A Open bite of right hand, initial encounter: Secondary | ICD-10-CM | POA: Diagnosis not present

## 2022-04-28 MED ORDER — DENOSUMAB 60 MG/ML ~~LOC~~ SOSY
60.0000 mg | PREFILLED_SYRINGE | Freq: Once | SUBCUTANEOUS | Status: AC
  Administered 2022-04-28: 60 mg via SUBCUTANEOUS

## 2022-04-28 NOTE — Telephone Encounter (Signed)
Called patient back to ensure patient went to ED, and the patient's husband went ahead and took patient to the ER at [facility] and they were currently there waiting to see the Dr.

## 2022-04-28 NOTE — Telephone Encounter (Signed)
Patient came in for prolia today and also patient's husband stated that the patient had been bitten by a ferral or neighbors cat yesterday. Patient has two separate places on both hands and neither one was red or infected looking but looking at patient's history in shots and the patient does not have a updated TDAP on file. Do you want patient to come in for an appointment to be seen with you to get the TDAP or should we recommend they go to a pharmacy that will give them the vaccine to have updated. Please advise.

## 2022-05-01 ENCOUNTER — Telehealth: Payer: Self-pay

## 2022-05-01 DIAGNOSIS — Z23 Encounter for immunization: Secondary | ICD-10-CM | POA: Diagnosis not present

## 2022-05-01 DIAGNOSIS — Z2914 Encounter for prophylactic rabies immune globin: Secondary | ICD-10-CM | POA: Diagnosis not present

## 2022-05-01 DIAGNOSIS — Z203 Contact with and (suspected) exposure to rabies: Secondary | ICD-10-CM | POA: Diagnosis not present

## 2022-05-01 NOTE — Telephone Encounter (Signed)
     Patient  visit on 04/28/2022  at [facility] was for cat bite.  Have you been able to follow up with your primary care physician? Yes  The patient was or was not able to obtain any needed medicine or equipment. Patient was able to obtain medication.  Are there diet recommendations that you are having difficulty following? No  Patient expresses understanding of discharge instructions and education provided has no other needs at this time. Yes   Kimberly [facility]ada [facility]  [facility] Population Health Community Resource Care Guide   [email or web address]  ?? [phone]   Website: [email or web address]

## 2022-05-05 ENCOUNTER — Telehealth: Payer: Self-pay

## 2022-05-05 DIAGNOSIS — Z203 Contact with and (suspected) exposure to rabies: Secondary | ICD-10-CM | POA: Diagnosis not present

## 2022-05-05 DIAGNOSIS — Z2914 Encounter for prophylactic rabies immune globin: Secondary | ICD-10-CM | POA: Diagnosis not present

## 2022-05-05 DIAGNOSIS — Z23 Encounter for immunization: Secondary | ICD-10-CM | POA: Diagnosis not present

## 2022-05-05 NOTE — Telephone Encounter (Signed)
Transition Care Management Follow-up Telephone Call Date of discharge and from where: 04/28/2022 [facility] How have you been since you were released from the hospital? Patient stated she is feeling fine. Any questions or concerns? Yes  Items Reviewed: Did the pt receive and understand the discharge instructions provided? Yes  Medications obtained and verified? Yes  Other? No  Any new allergies since your discharge? No  Dietary orders reviewed? Yes Do you have support at home? Yes     Follow up appointments reviewed:  PCP Hospital f/u appt confirmed?  Patient had follow-up with PCP.   Specialist Hospital f/u appt confirmed?  Patient has seen her PCP.   Are transportation arrangements needed? No  If their condition worsens, is the pt aware to call PCP or go to the Emergency Dept.? Yes Was the patient provided with contact information for the PCP's office or ED? Yes Was to pt encouraged to call back with questions or concerns? Yes  Netanel Yannuzzi [facility]  [facility] Population Health Community Resource Care Guide   [email or web address]  ?? [phone]   Website: [email or web address]

## 2022-05-15 DIAGNOSIS — Z23 Encounter for immunization: Secondary | ICD-10-CM | POA: Diagnosis not present

## 2022-05-15 DIAGNOSIS — W5501XA Bitten by cat, initial encounter: Secondary | ICD-10-CM | POA: Diagnosis not present

## 2022-05-15 DIAGNOSIS — Z203 Contact with and (suspected) exposure to rabies: Secondary | ICD-10-CM | POA: Diagnosis not present

## 2022-05-15 DIAGNOSIS — Z2914 Encounter for prophylactic rabies immune globin: Secondary | ICD-10-CM | POA: Diagnosis not present

## 2022-05-21 ENCOUNTER — Ambulatory Visit
Admission: RE | Admit: 2022-05-21 | Discharge: 2022-05-21 | Disposition: A | Discharge status: Home / Self Care | Source: Ambulatory Visit | Attending: Family Medicine | Admitting: Family Medicine

## 2022-05-21 DIAGNOSIS — Z1231 Encounter for screening mammogram for malignant neoplasm of breast: Secondary | ICD-10-CM

## 2022-06-02 ENCOUNTER — Other Ambulatory Visit: Payer: Self-pay

## 2022-06-02 DIAGNOSIS — G301 Alzheimer's disease with late onset: Secondary | ICD-10-CM | POA: Diagnosis not present

## 2022-06-02 DIAGNOSIS — F02B0 Dementia in other diseases classified elsewhere, moderate, without behavioral disturbance, psychotic disturbance, mood disturbance, and anxiety: Secondary | ICD-10-CM | POA: Diagnosis not present

## 2022-06-02 DIAGNOSIS — E782 Mixed hyperlipidemia: Secondary | ICD-10-CM

## 2022-06-02 MED ORDER — PRAVASTATIN SODIUM 40 MG PO TABS: 40.0000 mg | ORAL_TABLET | Freq: Every day | ORAL | 1 refills | Status: DC

## 2022-07-28 DIAGNOSIS — R21 Rash and other nonspecific skin eruption: Secondary | ICD-10-CM | POA: Diagnosis not present

## 2022-10-01 DIAGNOSIS — H9201 Otalgia, right ear: Secondary | ICD-10-CM | POA: Diagnosis not present

## 2022-10-01 DIAGNOSIS — H6091 Unspecified otitis externa, right ear: Secondary | ICD-10-CM | POA: Diagnosis not present

## 2022-10-15 ENCOUNTER — Ambulatory Visit: Payer: Medicare HMO | Admitting: Family Medicine

## 2022-10-15 ENCOUNTER — Encounter: Payer: Self-pay | Admitting: Family Medicine

## 2022-10-15 VITALS — BP 128/68 | HR 77 | Temp 97.8°F | Ht 61.5 in | Wt 124.0 lb

## 2022-10-15 DIAGNOSIS — R051 Acute cough: Secondary | ICD-10-CM

## 2022-10-15 DIAGNOSIS — Z23 Encounter for immunization: Secondary | ICD-10-CM | POA: Diagnosis not present

## 2022-10-15 DIAGNOSIS — H6092 Unspecified otitis externa, left ear: Secondary | ICD-10-CM | POA: Diagnosis not present

## 2022-10-15 MED ORDER — BENZONATATE 200 MG PO CAPS: 200.0000 mg | ORAL_CAPSULE | Freq: Three times a day (TID) | ORAL | 0 refills | Status: DC | PRN

## 2022-10-15 MED ORDER — CIPROFLOXACIN-DEXAMETHASONE 0.3-0.1 % OT SUSP: 4.0000 [drp] | Freq: Two times a day (BID) | OTIC | 0 refills | Status: DC

## 2022-10-15 NOTE — Progress Notes (Signed)
Acute Office Visit  Subjective:    Patient ID: Kimberly Estrada, female    DOB: 1945-03-01, 77 y.o.   MRN: [id number]  Chief Complaint  Patient presents with   Ear ache    HPI: Patient is in today for ear ache and a dry cough accompanied by her husband. He states that she has been complaining of ear pain for a few days and he looked and thought he saw the top of a Q-tip. Her husband states that she has had a dry cough that comes and goes. He states that she has allergies and takes Claritin once daily. She has no other symptoms.  Patient is also wanting to get her influenza shot today Past Medical History:  Diagnosis Date   GAD (generalized anxiety disorder)    Mixed hyperlipidemia    Nummular dermatitis    Osteoporosis    Palpitations     Past Surgical History:  Procedure Laterality Date   CATARACT EXTRACTION Bilateral     Family History  Problem Relation Age of Onset   Breast cancer Mother 59   CAD Father    Cancer Sister        colon    Social History   Socioeconomic History   Marital status: Married    Spouse name: Cynara Aguillard   Number of children: 3   Years of education: Not on file   Highest education level: Not on file  Occupational History   Not on file  Tobacco Use   Smoking status: Never   Smokeless tobacco: Never  Substance and Sexual Activity   Alcohol use: Never   Drug use: Never   Sexual activity: Yes    Partners: Female  Other Topics Concern   Not on file  Social History Narrative   Not on file   Social Determinants of Health   Financial Resource Strain: Low Risk  (08/21/2021)   Overall Financial Resource Strain (CARDIA)    Difficulty of Paying Living Expenses: Not hard at all  Food Insecurity: No Food Insecurity (08/21/2021)   Hunger Vital Sign    Worried About Running Out of Food in the Last Year: Never true    Ran Out of Food in the Last Year: Never true  Transportation Needs: No Transportation Needs (08/21/2021)   PRAPARE -  Administrator, Civil Service (Medical): No    Lack of Transportation (Non-Medical): No  Physical Activity: Insufficiently Active (08/21/2021)   Exercise Vital Sign    Days of Exercise per Week: 3 days    Minutes of Exercise per Session: 30 min  Stress: No Stress Concern Present (08/21/2021)   [employer] of Occupational Health - Occupational Stress Questionnaire    Feeling of Stress : Not at all  Social Connections: Moderately Integrated (02/23/2022)   Social Connection and Isolation Panel [NHANES]    Frequency of Communication with Friends and Family: Three times a week    Frequency of Social Gatherings with Friends and Family: Three times a week    Attends Religious Services: More than 4 times per year    Active Member of Clubs or Organizations: No    Attends Banker Meetings: Never    Marital Status: Married  Catering manager Violence: Not At Risk (02/23/2022)   Humiliation, Afraid, Rape, and Kick questionnaire    Fear of Current or Ex-Partner: No    Emotionally Abused: No    Physically Abused: No    Sexually Abused: No    Outpatient  Medications Prior to Visit  Medication Sig Dispense Refill   co-enzyme Q-10 30 MG capsule Take by mouth.     donepezil (ARICEPT) 23 MG TABS tablet Take 23 mg by mouth at bedtime.     memantine (NAMENDA) 10 MG tablet Take 1 tablet by mouth 2 (two) times daily.     omega-3 acid ethyl esters (LOVAZA) 1 g capsule Take 2 capsules (2 g total) by mouth 2 (two) times daily. 120 capsule 2   pravastatin (PRAVACHOL) 40 MG tablet Take 1 tablet (40 mg total) by mouth daily. 90 tablet 1   No facility-administered medications prior to visit.    No Known Allergies  Review of Systems  Constitutional:  Negative for appetite change, fatigue and fever.  HENT:  Positive for ear pain (Left ear pain). Negative for congestion, sinus pressure and sore throat.   Respiratory:  Positive for cough. Negative for chest tightness, shortness of  breath and wheezing.   Cardiovascular:  Negative for chest pain and palpitations.  Gastrointestinal:  Negative for abdominal pain, constipation, diarrhea, nausea and vomiting.  Genitourinary:  Negative for dysuria and hematuria.  Musculoskeletal:  Negative for arthralgias, back pain, joint swelling and myalgias.  Skin:  Negative for rash.  Neurological:  Negative for dizziness, weakness and headaches.  Psychiatric/Behavioral:  Negative for dysphoric mood. The patient is not nervous/anxious.        Objective:        10/15/2022    1:35 PM 02/23/2022    8:18 AM 08/21/2021    7:51 AM  Vitals with BMI  Height 5' 1.5" 5' 1.5" 5\' 0"   Weight 124 lbs 120 lbs 117 lbs  BMI 23.05 22.31 22.85  Systolic 128 122 161  Diastolic 68 76 56  Pulse 77 72 60    Orthostatic VS for the past 72 hrs (Last 3 readings):  Patient Position BP Location  10/15/22 1335 Sitting Left Arm     Physical Exam Constitutional:      General: She is not in acute distress.    Appearance: Normal appearance.  HENT:     Right Ear: Tympanic membrane normal.     Left Ear: Tenderness present.     Ears:     Comments: Left sore  Neck:     Vascular: No carotid bruit.  Cardiovascular:     Rate and Rhythm: Normal rate and regular rhythm.     Heart sounds: Normal heart sounds.  Pulmonary:     Effort: Pulmonary effort is normal.     Breath sounds: Normal breath sounds.  Abdominal:     Tenderness: There is no abdominal tenderness.  Neurological:     Mental Status: She is alert. Mental status is at baseline.  Psychiatric:        Mood and Affect: Mood normal.        Behavior: Behavior normal.     Health Maintenance Due  Topic Date Due   Hepatitis C Screening  Never done   DTaP/Tdap/Td (1 - Tdap) Never done   Zoster Vaccines- Shingrix (1 of 2) Never done   COVID-19 Vaccine (5 - 2023-24 season) 09/06/2022    There are no preventive care reminders to display for this patient.   Lab Results  Component Value  Date   TSH 1.480 09/24/2020   Lab Results  Component Value Date   WBC 6.8 02/23/2022   HGB 15.1 02/23/2022   HCT 44.6 02/23/2022   MCV 86 02/23/2022   PLT 254 02/23/2022   Lab  Results  Component Value Date   NA 139 02/23/2022   K 4.5 02/23/2022   CO2 22 02/23/2022   GLUCOSE 96 02/23/2022   BUN 10 02/23/2022   CREATININE 0.66 02/23/2022   BILITOT 0.4 02/23/2022   ALKPHOS 58 02/23/2022   AST 17 02/23/2022   ALT 14 02/23/2022   PROT 6.9 02/23/2022   ALBUMIN 4.3 02/23/2022   CALCIUM 9.3 02/23/2022   EGFR 91 02/23/2022   Lab Results  Component Value Date   CHOL 222 (H) 02/23/2022   Lab Results  Component Value Date   HDL 59 02/23/2022   Lab Results  Component Value Date   LDLCALC 120 (H) 02/23/2022   Lab Results  Component Value Date   TRIG 248 (H) 02/23/2022   Lab Results  Component Value Date   CHOLHDL 3.8 02/23/2022   No results found for: "HGBA1C"     Assessment & Plan:  Otitis externa of left ear, unspecified chronicity, unspecified type Assessment & Plan: Acute Ciprodex Otic drops, 4 drops to the left ear TWICE A DAY  Please call to follow-up for any new or worsening ear pain, redness, irritation, or discharge   Orders: -     Ciprofloxacin-dexAMETHasone; Place 4 drops into the left ear 2 (two) times daily.  Dispense: 7.5 mL; Refill: 0  Acute cough Assessment & Plan: Acute Tessalon 200 mg by mouth THREE TIMES A DAY as needed for cough Increase fluids We may need to try a different antihistamine    Orders: -     Benzonatate; Take 1 capsule (200 mg total) by mouth 3 (three) times daily as needed for cough.  Dispense: 30 capsule; Refill: 0  Encounter for immunization -     Flu Vaccine Trivalent High Dose (Fluad)     Meds ordered this encounter  Medications   ciprofloxacin-dexamethasone (CIPRODEX) OTIC suspension    Sig: Place 4 drops into the left ear 2 (two) times daily.    Dispense:  7.5 mL    Refill:  0   benzonatate (TESSALON) 200 MG  capsule    Sig: Take 1 capsule (200 mg total) by mouth 3 (three) times daily as needed for cough.    Dispense:  30 capsule    Refill:  0    Orders Placed This Encounter  Procedures   Flu Vaccine Trivalent High Dose (Fluad)     Follow-up: Return if symptoms worsen or fail to improve.  An After Visit Summary was printed and given to the patient.  Total time spent on today's visit was greater than 30 minutes, including both face-to-face time and nonface-to-face time personally spent on review of chart (labs and imaging), discussing labs and goals, discussing further work-up, treatment options, referrals to specialist if needed, reviewing outside records if pertinent, answering patient's questions, and coordinating care.   Lajuana Matte, FNP [facility] [phone]

## 2022-10-15 NOTE — Assessment & Plan Note (Signed)
Acute Tessalon 200 mg by mouth THREE TIMES A DAY as needed for cough Increase fluids We may need to try a different antihistamine

## 2022-10-15 NOTE — Assessment & Plan Note (Signed)
Acute Ciprodex Otic drops, 4 drops to the left ear TWICE A DAY  Please call to follow-up for any new or worsening ear pain, redness, irritation, or discharge

## 2022-10-23 ENCOUNTER — Telehealth: Payer: Self-pay

## 2022-10-23 MED ORDER — DENOSUMAB 60 MG/ML ~~LOC~~ SOSY: 60.0000 mg | PREFILLED_SYRINGE | Freq: Once | SUBCUTANEOUS | 0 refills | Status: AC

## 2022-10-23 NOTE — Telephone Encounter (Signed)
Patient: Kimberly Estrada  DOB: Feb 17, 1945  MRN: [id number]  Prolia benefits verified by [employer] on: 10/12/22 Coverage Available: Both Buy & Bill and Pharmacy Prior authorization NOT REQUIRED Deductible: does not apply  Benefit Details: : For the primary MD Purchase option, Prolia and administration will be subject to a 20% coinsurance up to a $4500 out of pocket max ($455.32 met). No deductible applies.  For the secondary MD Purchase option, the plan will coordinate benefits and will consider 100% of remaining expenses. No deductible applies.   COVER MY MEDS RESPONSE: The patient currently has access to the requested medication and a Prior Authorization is not needed for the patient/medication.  RX sent to [facility]  Patient agreed to proceed with ordering and will be scheduled once it has arrived in the office.  Jacklynn Bue, LPN

## 2022-10-27 ENCOUNTER — Other Ambulatory Visit: Payer: Self-pay

## 2022-11-14 DIAGNOSIS — M7981 Nontraumatic hematoma of soft tissue: Secondary | ICD-10-CM | POA: Diagnosis not present

## 2023-01-05 DIAGNOSIS — G301 Alzheimer's disease with late onset: Secondary | ICD-10-CM | POA: Diagnosis not present

## 2023-01-05 DIAGNOSIS — F02B0 Dementia in other diseases classified elsewhere, moderate, without behavioral disturbance, psychotic disturbance, mood disturbance, and anxiety: Secondary | ICD-10-CM | POA: Diagnosis not present

## 2023-01-20 ENCOUNTER — Encounter: Payer: Self-pay | Admitting: Physician Assistant

## 2023-01-20 ENCOUNTER — Ambulatory Visit (INDEPENDENT_AMBULATORY_CARE_PROVIDER_SITE_OTHER): Payer: Medicare HMO | Admitting: Physician Assistant

## 2023-01-20 VITALS — BP 130/78 | HR 98 | Temp 98.1°F | Resp 16 | Ht 61.5 in | Wt 127.2 lb

## 2023-01-20 DIAGNOSIS — L2089 Other atopic dermatitis: Secondary | ICD-10-CM | POA: Diagnosis not present

## 2023-01-20 MED ORDER — TRIAMCINOLONE ACETONIDE 0.1 % EX CREA: 1.0000 | TOPICAL_CREAM | Freq: Two times a day (BID) | CUTANEOUS | 2 refills | Status: DC

## 2023-01-20 NOTE — Progress Notes (Signed)
Acute Office Visit  Subjective:    Patient ID: Kimberly Estrada, female    DOB: February 08, 1945, 78 y.o.   MRN: [id number]  Chief Complaint  Patient presents with   Rash   Patient presents today with a rash/skin irritation. She states she does not know the cause. She states she has had this irritation for approximately three days.  HPI: Patient is in today for rash on her right arm Discussed the use of AI scribe software for clinical note transcription with the patient, who gave verbal consent to proceed.  History of Present Illness   A 78 year old patient presents with a rash on her arm that has been present for approximately three to four days. The rash started after what the patient believes was a bug bite. The rash has been itchy, and the patient has been applying Neosporin and Benadryl cream without much relief. The patient denies any history of eczema or psoriasis. There have been no recent changes in soaps or lotions. The patient denies any recent fevers, new medicines, abdominal pain, or numbness/tingling in the hand.       Past Medical History:  Diagnosis Date   GAD (generalized anxiety disorder)    Mixed hyperlipidemia    Nummular dermatitis    Osteoporosis    Palpitations     Past Surgical History:  Procedure Laterality Date   CATARACT EXTRACTION Bilateral     Family History  Problem Relation Age of Onset   Breast cancer Mother 59   CAD Father    Cancer Sister        colon    Social History   Socioeconomic History   Marital status: Married    Spouse name: Carissa Dugo   Number of children: 3   Years of education: Not on file   Highest education level: Not on file  Occupational History   Not on file  Tobacco Use   Smoking status: Never   Smokeless tobacco: Never  Substance and Sexual Activity   Alcohol use: Never   Drug use: Never   Sexual activity: Yes    Partners: Female  Other Topics Concern   Not on file  Social History Narrative   Not on file    Social Drivers of Health   Financial Resource Strain: Low Risk  (08/21/2021)   Overall Financial Resource Strain (CARDIA)    Difficulty of Paying Living Expenses: Not hard at all  Food Insecurity: No Food Insecurity (08/21/2021)   Hunger Vital Sign    Worried About Running Out of Food in the Last Year: Never true    Ran Out of Food in the Last Year: Never true  Transportation Needs: No Transportation Needs (08/21/2021)   PRAPARE - Administrator, Civil Service (Medical): No    Lack of Transportation (Non-Medical): No  Physical Activity: Insufficiently Active (08/21/2021)   Exercise Vital Sign    Days of Exercise per Week: 3 days    Minutes of Exercise per Session: 30 min  Stress: No Stress Concern Present (08/21/2021)   [employer] of Occupational Health - Occupational Stress Questionnaire    Feeling of Stress : Not at all  Social Connections: Moderately Integrated (02/23/2022)   Social Connection and Isolation Panel [NHANES]    Frequency of Communication with Friends and Family: Three times a week    Frequency of Social Gatherings with Friends and Family: Three times a week    Attends Religious Services: More than 4 times per year  Active Member of Clubs or Organizations: No    Attends Banker Meetings: Never    Marital Status: Married  Catering manager Violence: Not At Risk (02/23/2022)   Humiliation, Afraid, Rape, and Kick questionnaire    Fear of Current or Ex-Partner: No    Emotionally Abused: No    Physically Abused: No    Sexually Abused: No    Outpatient Medications Prior to Visit  Medication Sig Dispense Refill   benzonatate (TESSALON) 200 MG capsule Take 1 capsule (200 mg total) by mouth 3 (three) times daily as needed for cough. 30 capsule 0   ciprofloxacin-dexamethasone (CIPRODEX) OTIC suspension Place 4 drops into the left ear 2 (two) times daily. 7.5 mL 0   co-enzyme Q-10 30 MG capsule Take by mouth.     donepezil (ARICEPT) 23 MG  TABS tablet Take 23 mg by mouth at bedtime.     memantine (NAMENDA) 10 MG tablet Take 1 tablet by mouth 2 (two) times daily.     omega-3 acid ethyl esters (LOVAZA) 1 g capsule Take 2 capsules (2 g total) by mouth 2 (two) times daily. 120 capsule 2   pravastatin (PRAVACHOL) 40 MG tablet Take 1 tablet (40 mg total) by mouth daily. 90 tablet 1   No facility-administered medications prior to visit.    No Known Allergies  Review of Systems  Constitutional:  Negative for chills, fatigue and fever.  HENT:  Negative for congestion, ear pain and sore throat.   Respiratory:  Negative for cough and shortness of breath.   Cardiovascular:  Negative for chest pain and palpitations.  Gastrointestinal:  Negative for abdominal pain, constipation, diarrhea, nausea and vomiting.  Genitourinary:  Negative for difficulty urinating and dysuria.  Musculoskeletal:  Negative for arthralgias, back pain and myalgias.  Skin:  Positive for rash.  Neurological:  Negative for dizziness and headaches.  Psychiatric/Behavioral:  Negative for dysphoric mood.        Objective:        01/20/2023    1:13 PM 10/15/2022    1:35 PM 02/23/2022    8:18 AM  Vitals with BMI  Height 5' 1.5" 5' 1.5" 5' 1.5"  Weight 127 lbs 3 oz 124 lbs 120 lbs  BMI 23.65 23.05 22.31  Systolic 130 128 536  Diastolic 78 68 76  Pulse 98 77 72    Orthostatic VS for the past 72 hrs (Last 3 readings):  Patient Position BP Location Cuff Size  01/20/23 1313 Sitting Left Arm Normal     Physical Exam Vitals reviewed.  Constitutional:      Appearance: Normal appearance.  Neck:     Vascular: No carotid bruit.  Cardiovascular:     Rate and Rhythm: Normal rate and regular rhythm.     Heart sounds: Normal heart sounds.  Pulmonary:     Effort: Pulmonary effort is normal.     Breath sounds: Normal breath sounds.  Abdominal:     General: Bowel sounds are normal.     Palpations: Abdomen is soft.     Tenderness: There is no abdominal  tenderness.  Skin:    General: Skin is warm.     Capillary Refill: Capillary refill takes less than 2 seconds.     Findings: Abrasion, erythema, rash and wound present. Rash is purpuric, scaling and urticarial.          Comments: Wound on the distal part of the arm. No current signs of cellulitis.   Neurological:     Mental  Status: She is alert and oriented to person, place, and time.  Psychiatric:        Mood and Affect: Mood normal.        Behavior: Behavior normal.     Health Maintenance Due  Topic Date Due   Hepatitis C Screening  Never done   DTaP/Tdap/Td (1 - Tdap) Never done   Zoster Vaccines- Shingrix (1 of 2) Never done   COVID-19 Vaccine (5 - 2024-25 season) 09/06/2022   Medicare Annual Wellness (AWV)  02/24/2023    There are no preventive care reminders to display for this patient.   Lab Results  Component Value Date   TSH 1.480 09/24/2020   Lab Results  Component Value Date   WBC 6.8 02/23/2022   HGB 15.1 02/23/2022   HCT 44.6 02/23/2022   MCV 86 02/23/2022   PLT 254 02/23/2022   Lab Results  Component Value Date   NA 139 02/23/2022   K 4.5 02/23/2022   CO2 22 02/23/2022   GLUCOSE 96 02/23/2022   BUN 10 02/23/2022   CREATININE 0.66 02/23/2022   BILITOT 0.4 02/23/2022   ALKPHOS 58 02/23/2022   AST 17 02/23/2022   ALT 14 02/23/2022   PROT 6.9 02/23/2022   ALBUMIN 4.3 02/23/2022   CALCIUM 9.3 02/23/2022   EGFR 91 02/23/2022   Lab Results  Component Value Date   CHOL 222 (H) 02/23/2022   Lab Results  Component Value Date   HDL 59 02/23/2022   Lab Results  Component Value Date   LDLCALC 120 (H) 02/23/2022   Lab Results  Component Value Date   TRIG 248 (H) 02/23/2022   Lab Results  Component Value Date   CHOLHDL 3.8 02/23/2022   No results found for: "HGBA1C"     Assessment & Plan:  Flexural atopic dermatitis Assessment & Plan: Itchy, flaky rash on the arm for 3-4 days, possibly eczema. No history of eczema, psoriasis, or recent  changes in soaps or lotions. No systemic symptoms. No recent medication changes. -Apply steroid cream twice daily. -Continue Neosporin and bandage on the smaller wound until it is the size of the tip of the pinky. -Use Eucerin cream on both arms during winter months to prevent recurrence. -Call office if rash is not improving by Friday (01/22/2023) for possible antibiotic cream prescription.  Orders: -     Triamcinolone Acetonide; Apply 1 Application topically 2 (two) times daily.  Dispense: 45 g; Refill: 2     Meds ordered this encounter  Medications   triamcinolone cream (KENALOG) 0.1 %    Sig: Apply 1 Application topically 2 (two) times daily.    Dispense:  45 g    Refill:  2    Supervising Provider:   CO[facility], KIRSTEN [id number]    No orders of the defined types were placed in this encounter.       Follow-up: No follow-ups on file.  An After Visit Summary was printed and given to the patient.  Langley Gauss, [facility] [phone]

## 2023-01-22 ENCOUNTER — Telehealth: Payer: Self-pay

## 2023-01-22 DIAGNOSIS — L2089 Other atopic dermatitis: Secondary | ICD-10-CM | POA: Insufficient documentation

## 2023-01-22 NOTE — Assessment & Plan Note (Signed)
Itchy, flaky rash on the arm for 3-4 days, possibly eczema. No history of eczema, psoriasis, or recent changes in soaps or lotions. No systemic symptoms. No recent medication changes. -Apply steroid cream twice daily. -Continue Neosporin and bandage on the smaller wound until it is the size of the tip of the pinky. -Use Eucerin cream on both arms during winter months to prevent recurrence. -Call office if rash is not improving by Friday (01/22/2023) for possible antibiotic cream prescription.

## 2023-01-22 NOTE — Telephone Encounter (Signed)
Kimberly Estrada is aware.

## 2023-01-22 NOTE — Telephone Encounter (Signed)
Copied from CRM [id number]. Topic: General - Other >> Jan 22, 2023  9:17 AM Maxwell Marion wrote: Reason for CRM: Patient's husband called to let the doctor know the patient's rash is doing better

## 2023-01-26 ENCOUNTER — Ambulatory Visit: Payer: Self-pay | Admitting: Family Medicine

## 2023-01-26 ENCOUNTER — Ambulatory Visit: Payer: Medicare HMO | Admitting: Physician Assistant

## 2023-01-26 ENCOUNTER — Encounter: Payer: Self-pay | Admitting: Physician Assistant

## 2023-01-26 VITALS — BP 120/78 | HR 66 | Temp 97.9°F | Resp 16 | Ht 61.5 in | Wt 127.8 lb

## 2023-01-26 DIAGNOSIS — L2089 Other atopic dermatitis: Secondary | ICD-10-CM | POA: Diagnosis not present

## 2023-01-26 MED ORDER — BACITRACIN 500 UNIT/GM EX OINT: 1.0000 | TOPICAL_OINTMENT | Freq: Two times a day (BID) | CUTANEOUS | 3 refills | Status: DC

## 2023-01-26 NOTE — Telephone Encounter (Signed)
  Chief Complaint: worsening right arm symptoms Symptoms: redness and pain to right arm Frequency: since office visit 01/20/23 Pertinent Negatives: Patient denies swelling, rash, fever, numbness, weakness, chest pain Disposition: [] ED /[] Urgent Care (no appt availability in office) / [x] Appointment(In office/virtual)/ []  [facility]/ [] Home Care/ [] Refused Recommended Disposition /[] [facility]/ []  Follow-up with PCP Additional Notes: Patient seen on 01/20/23 for office visit, husband called in on 01/22/23 and said it was improving. Husband states it is now looking worse. He states red area to patient's right arm is about 4 inches. Patient has not taken any OTC ibuprofren or tylenol for pain, instructed patient can take for pain before office visit today. Husband states patient has been using her steroid cream, Eucerina and neosporin for home care. Copied from CRM [id number]. Topic: Clinical - Pink Word Triage >> Jan 26, 2023 10:06 AM Alvino Blood C wrote: Reason for Triage: Patient is having a flare up on her right are from the elbow to her hand. She describes the pain as burning as if she's on fire. She was seen on Friday but the area is not healing Reason for Disposition  [1] Red area or streak AND [2] large (> 2 in. or 5 cm)  Answer Assessment - Initial Assessment Questions 1. ONSET: "When did the pain start?"     01/20/23.  2. LOCATION: "Where is the pain located?"     Right arm from hand to elbow.  3. PAIN: "How bad is the pain?" (Scale 1-10; or mild, moderate, severe)   - MILD (1-3): Doesn't interfere with normal activities.   - MODERATE (4-7): Interferes with normal activities (e.g., work or school) or awakens from sleep.   - SEVERE (8-10): Excruciating pain, unable to do any normal activities, unable to hold a cup of water.     8/10.  4. WORK OR EXERCISE: "Has there been any recent work or exercise that involved this part of the body?"     Denies.  5. CAUSE: "What do  you think is causing the arm pain?"     Patient was seen in office for visit on 01/20/23 and was told it might be eczema. Husband states he is unsure.  6. OTHER SYMPTOMS: "Do you have any other symptoms?" (e.g., neck pain, swelling, rash, fever, numbness, weakness)     Redness to right arm.  Protocols used: Arm Pain-A-AH

## 2023-01-26 NOTE — Progress Notes (Signed)
Acute Office Visit  Subjective:    Patient ID: Kimberly Estrada, female    DOB: 02-Nov-1945, 78 y.o.   MRN: [id number]  Chief Complaint  Patient presents with   Rash    Discussed the use of AI scribe software for clinical note transcription with the patient, who gave verbal consent to proceed.   HPI: Patient is in today for rash and irritation on her right arm. Patient states it has been like this for roughly two weeks and has not improved. Discussed the use of AI scribe software for clinical note transcription with the patient, who gave verbal consent to proceed.  History of Present Illness   The patient, with a history of skin irritation, presents with persistent skin discomfort despite using Benadryl cream and Eucerin cream. The condition has been characterized by itching and the development of yellow spots, which the patient admits to scratching at times. The patient reports that the condition seems to fluctuate, with periods of improvement followed by worsening of symptoms. The patient has not noticed any yellow crusting from the affected areas.       Past Medical History:  Diagnosis Date   GAD (generalized anxiety disorder)    Mixed hyperlipidemia    Nummular dermatitis    Osteoporosis    Palpitations     Past Surgical History:  Procedure Laterality Date   CATARACT EXTRACTION Bilateral     Family History  Problem Relation Age of Onset   Breast cancer Mother 62   CAD Father    Cancer Sister        colon    Social History   Socioeconomic History   Marital status: Married    Spouse name: Ady Mcfall   Number of children: 3   Years of education: Not on file   Highest education level: Not on file  Occupational History   Not on file  Tobacco Use   Smoking status: Never   Smokeless tobacco: Never  Substance and Sexual Activity   Alcohol use: Never   Drug use: Never   Sexual activity: Yes    Partners: Female  Other Topics Concern   Not on file  Social  History Narrative   Not on file   Social Drivers of Health   Financial Resource Strain: Low Risk  (08/21/2021)   Overall Financial Resource Strain (CARDIA)    Difficulty of Paying Living Expenses: Not hard at all  Food Insecurity: No Food Insecurity (08/21/2021)   Hunger Vital Sign    Worried About Running Out of Food in the Last Year: Never true    Ran Out of Food in the Last Year: Never true  Transportation Needs: No Transportation Needs (08/21/2021)   PRAPARE - Administrator, Civil Service (Medical): No    Lack of Transportation (Non-Medical): No  Physical Activity: Insufficiently Active (08/21/2021)   Exercise Vital Sign    Days of Exercise per Week: 3 days    Minutes of Exercise per Session: 30 min  Stress: No Stress Concern Present (08/21/2021)   [employer] of Occupational Health - Occupational Stress Questionnaire    Feeling of Stress : Not at all  Social Connections: Moderately Integrated (02/23/2022)   Social Connection and Isolation Panel [NHANES]    Frequency of Communication with Friends and Family: Three times a week    Frequency of Social Gatherings with Friends and Family: Three times a week    Attends Religious Services: More than 4 times per year  Active Member of Clubs or Organizations: No    Attends Banker Meetings: Never    Marital Status: Married  Catering manager Violence: Not At Risk (02/23/2022)   Humiliation, Afraid, Rape, and Kick questionnaire    Fear of Current or Ex-Partner: No    Emotionally Abused: No    Physically Abused: No    Sexually Abused: No    Outpatient Medications Prior to Visit  Medication Sig Dispense Refill   benzonatate (TESSALON) 200 MG capsule Take 1 capsule (200 mg total) by mouth 3 (three) times daily as needed for cough. 30 capsule 0   ciprofloxacin-dexamethasone (CIPRODEX) OTIC suspension Place 4 drops into the left ear 2 (two) times daily. 7.5 mL 0   co-enzyme Q-10 30 MG capsule Take by mouth.      donepezil (ARICEPT) 23 MG TABS tablet Take 23 mg by mouth at bedtime.     doxycycline (VIBRA-TABS) 100 MG tablet Take 100 mg by mouth 2 (two) times daily.     memantine (NAMENDA) 10 MG tablet Take 1 tablet by mouth 2 (two) times daily.     omega-3 acid ethyl esters (LOVAZA) 1 g capsule Take 2 capsules (2 g total) by mouth 2 (two) times daily. 120 capsule 2   pravastatin (PRAVACHOL) 40 MG tablet Take 1 tablet (40 mg total) by mouth daily. 90 tablet 1   triamcinolone cream (KENALOG) 0.1 % Apply 1 Application topically 2 (two) times daily. 45 g 2   No facility-administered medications prior to visit.    No Known Allergies  Review of Systems  Constitutional:  Negative for chills, fatigue and fever.  HENT:  Negative for congestion, ear pain and sore throat.   Respiratory:  Negative for cough and shortness of breath.   Cardiovascular:  Negative for chest pain and palpitations.  Gastrointestinal:  Negative for abdominal pain, constipation, diarrhea, nausea and vomiting.  Genitourinary:  Negative for difficulty urinating and dysuria.  Musculoskeletal:  Negative for arthralgias, back pain and myalgias.  Skin:  Positive for rash.  Neurological:  Negative for dizziness and headaches.  Psychiatric/Behavioral:  Negative for dysphoric mood.        Objective:        01/26/2023    2:32 PM 01/20/2023    1:13 PM 10/15/2022    1:35 PM  Vitals with BMI  Height 5' 1.5" 5' 1.5" 5' 1.5"  Weight 127 lbs 13 oz 127 lbs 3 oz 124 lbs  BMI 23.76 23.65 23.05  Systolic 120 130 960  Diastolic 78 78 68  Pulse 66 98 77    Orthostatic VS for the past 72 hrs (Last 3 readings):  Patient Position BP Location Cuff Size  01/26/23 1432 Sitting Left Arm Normal     Physical Exam Vitals reviewed.  Constitutional:      Appearance: Normal appearance.  Neck:     Vascular: No carotid bruit.  Cardiovascular:     Rate and Rhythm: Normal rate and regular rhythm.     Heart sounds: Normal heart sounds.   Pulmonary:     Effort: Pulmonary effort is normal.     Breath sounds: Normal breath sounds.  Abdominal:     General: Bowel sounds are normal.     Palpations: Abdomen is soft.     Tenderness: There is no abdominal tenderness.  Skin:         Comments: Decreased erythema, pustular formation noted.   Neurological:     Mental Status: She is alert and oriented to person,  place, and time.  Psychiatric:        Mood and Affect: Mood normal.        Behavior: Behavior normal.     Media Information  Document Information  Photos    01/26/2023 15:04  Attached To:  Office Visit on 01/26/23 with Langley Gauss, PA  Source Information  Langley Gauss, [place]  [facility]  Document History     Media Information  Document Information  Photos    01/26/2023 15:04  Attached To:  Office Visit on 01/26/23 with Langley Gauss, PA  Source Information  Niana Martorana, [place], [place]  [facility]  Document History     Health Maintenance Due  Topic Date Due   Hepatitis C Screening  Never done   DTaP/Tdap/Td (1 - Tdap) Never done   Zoster Vaccines- Shingrix (1 of 2) Never done   COVID-19 Vaccine (5 - 2024-25 season) 09/06/2022   Medicare Annual Wellness (AWV)  02/24/2023    There are no preventive care reminders to display for this patient.   Lab Results  Component Value Date   TSH 1.480 09/24/2020   Lab Results  Component Value Date   WBC 6.8 02/23/2022   HGB 15.1 02/23/2022   HCT 44.6 02/23/2022   MCV 86 02/23/2022   PLT 254 02/23/2022   Lab Results  Component Value Date   NA 139 02/23/2022   K 4.5 02/23/2022   CO2 22 02/23/2022   GLUCOSE 96 02/23/2022   BUN 10 02/23/2022   CREATININE 0.66 02/23/2022   BILITOT 0.4 02/23/2022   ALKPHOS 58 02/23/2022   AST 17 02/23/2022   ALT 14 02/23/2022   PROT 6.9 02/23/2022   ALBUMIN 4.3 02/23/2022   CALCIUM 9.3 02/23/2022   EGFR 91 02/23/2022   Lab Results  Component Value Date   CHOL 222 (H) 02/23/2022   Lab Results   Component Value Date   HDL 59 02/23/2022   Lab Results  Component Value Date   LDLCALC 120 (H) 02/23/2022   Lab Results  Component Value Date   TRIG 248 (H) 02/23/2022   Lab Results  Component Value Date   CHOLHDL 3.8 02/23/2022   No results found for: "HGBA1C"    Total time spent on today's visit was greater than 20 minutes, including both face-to-face time and nonface-to-face time personally spent on review of chart (labs and imaging), discussing labs and goals, discussing further work-up, treatment options, referrals to specialist if needed, reviewing outside records of pertinent, answering patient's questions, and coordinating care.  Assessment & Plan:  Flexural atopic dermatitis Assessment & Plan: Persistent skin irritation with signs of secondary infection. The patient has been scratching the affected areas, which may have contributed to the infection. Benadryl cream provided some relief but was not fully effective. -Start topical antibiotic ointment to treat the infection and moisturize the skin. -Refer to dermatology for further evaluation and management. If the condition improves significantly, the patient may cancel the appointment. If the condition persists or worsens, the patient should keep the appointment. -Advise the patient to monitor for signs of worsening infection, such as deep redness or fever, and to seek urgent care if these occur.   Orders: -     Bacitracin; Apply 1 Application topically 2 (two) times daily.  Dispense: 28 g; Refill: 3 -     Ambulatory referral to Dermatology     Meds ordered this encounter  Medications   bacitracin 500 UNIT/GM ointment    Sig: Apply 1 Application  topically 2 (two) times daily.    Dispense:  28 g    Refill:  3    Orders Placed This Encounter  Procedures   Ambulatory referral to Dermatology         Follow-up: No follow-ups on file.  An After Visit Summary was printed and given to the patient.  Langley Gauss,  [place] [facility] [phone]

## 2023-01-26 NOTE — Assessment & Plan Note (Signed)
Persistent skin irritation with signs of secondary infection. The patient has been scratching the affected areas, which may have contributed to the infection. Benadryl cream provided some relief but was not fully effective. -Start topical antibiotic ointment to treat the infection and moisturize the skin. -Refer to dermatology for further evaluation and management. If the condition improves significantly, the patient may cancel the appointment. If the condition persists or worsens, the patient should keep the appointment. -Advise the patient to monitor for signs of worsening infection, such as deep redness or fever, and to seek urgent care if these occur.

## 2023-02-02 ENCOUNTER — Telehealth: Payer: Self-pay

## 2023-02-02 NOTE — Telephone Encounter (Signed)
Copied from CRM [id number]. Topic: Referral - Status >> Feb 02, 2023 11:40 AM Joanette Gula wrote: Patient called to inquire about her Dermatology Referral. She stated that she needs to get in quickly due to her condition getting worse by the day.

## 2023-02-04 ENCOUNTER — Ambulatory Visit: Payer: Self-pay | Admitting: Family Medicine

## 2023-02-04 NOTE — Telephone Encounter (Addendum)
Chief Complaint: bilateral arm pain with red dots Symptoms: warm pain ("burning"), red dots, dry, itchy, red skin Frequency: symptoms since approx 01/22/23 Pertinent Negatives: No drainage, no pus, no blisters, no fever, no difficulty breathing, no lip or tongue swelling, no streaking redness Disposition: [] ED /[] Urgent Care (no appt availability in office) / [x] Appointment(In office/virtual)/ []  [place] [facility]/ [] Home Care/ [x] Refused Recommended Disposition /[] [place] [facility]/ []  Follow-up with PCP Additional Notes: Husband Bernette Redbird calling on behalf of pt. Bernette Redbird reports pt's arm pain has only improved slightly with bacitracin. Seen 1/21 for same. Husband endorses red spots to both arms. Worse on R arm. 8/10 pain. States skin is red and itchy. Denies pus, drainage, streaking redness, fever. States skin is warm. Husband states they have been waiting for a call from dermatology, states they were referred to derm. RN advised pt be seen in the next 24 hours and offered an office visit. Husband refused and states they will either wait for a derm appt or go to the ED. RN advised the husband RN will relay all of this to the office for follow-up. RN advised that if pt worsens, develops diff breathing, airway tightening, swelling, drainage, pus, fever, N/V to call back or go to the ED. Pt verbalized understanding.  This RN called the [facility] and spoke with an office member that states the pt was most likely referred to [facility] and that the pt  will not need a referral to make an appt.  CAL stated RN should call pt and give them [facility] number. RN called and gave that number to husband Loise Esguerra who states he would call them.   Copied from CRM [id number]. Topic: Clinical - Red Word Triage >> Feb 04, 2023  3:15 PM Alvino Blood C wrote: Red Word that prompted transfer to Nurse Triage: Patient is experiencing a flare up of some sort in her arms with a burning sensation and now its moved to both  arms. Reason for Disposition  [1] Painful rash AND [2] multiple small blisters grouped together (i.e., dermatomal distribution or "band" or "stripe")  Answer Assessment - Initial Assessment Questions 1. ONSET: "When did the pain start?"     "Not sure - I have seen Huston Foley twice and waited 4-5 days before that." 2. LOCATION: "Where is the pain located?"     Both arms  3. PAIN: "How bad is the pain?" (Scale 1-10; or mild, moderate, severe)   - MILD (1-3): Doesn't interfere with normal activities.   - MODERATE (4-7): Interferes with normal activities (e.g., work or school) or awakens from sleep.   - SEVERE (8-10): Excruciating pain, unable to do any normal activities, unable to hold a cup of water.     8/10 5. CAUSE: "What do you think is causing the arm pain?"     Not sure 6. OTHER SYMPTOMS: "Do you have any other symptoms?" (e.g., neck pain, swelling, rash, fever, numbness, weakness)     "Red dots all the way down" worse on R, now starting on L. On L, from hand to elbow is solid red, it's rough, and she said both burn like fire. Especially after I put antibiotic on. No drainage or pus. Itchy. Warm. 8/10. No recent insect bites. Antibiotic ointment has "helped very little."  Protocols used: Arm Pain-A-AH

## 2023-02-05 ENCOUNTER — Ambulatory Visit (HOSPITAL_BASED_OUTPATIENT_CLINIC_OR_DEPARTMENT_OTHER)
Admission: RE | Admit: 2023-02-05 | Discharge: 2023-02-05 | Disposition: A | Discharge status: Home / Self Care | Payer: Medicare HMO | Source: Ambulatory Visit | Attending: Family Medicine | Admitting: Family Medicine

## 2023-02-05 ENCOUNTER — Encounter (HOSPITAL_BASED_OUTPATIENT_CLINIC_OR_DEPARTMENT_OTHER): Payer: Self-pay

## 2023-02-05 VITALS — BP 154/69 | HR 69 | Temp 97.8°F | Resp 20

## 2023-02-05 DIAGNOSIS — L309 Dermatitis, unspecified: Secondary | ICD-10-CM | POA: Diagnosis not present

## 2023-02-05 MED ORDER — PIMECROLIMUS 1 % EX CREA: TOPICAL_CREAM | Freq: Two times a day (BID) | CUTANEOUS | 0 refills | Status: DC

## 2023-02-05 NOTE — ED Provider Notes (Signed)
[facility]    CSN: [id number] Arrival date & time: 02/05/23  1540      History   Chief Complaint Chief Complaint  Patient presents with   Rash    HPI Kimberly Estrada is a 78 y.o. female.   HPI Here for rash on both arms. It's been there about a month. She has seen her primary care twice, and first was treated with triamcinolone topical, then bacitracin. It does itch some. No dc. No f/c.  She does wash her forearms and upper arms when washing her hands. Using bath and body works soap.  She has been referred to a dermatologist, and will see them 2/19.  Past Medical History:  Diagnosis Date   GAD (generalized anxiety disorder)    Mixed hyperlipidemia    Nummular dermatitis    Osteoporosis    Palpitations     Patient Active Problem List   Diagnosis Date Noted   Flexural atopic dermatitis 01/22/2023   Acute cough 10/15/2022   Otitis externa of left ear 10/15/2022   SAH (subarachnoid hemorrhage) (HCC) 11/19/2021   Syncope 11/19/2021   Osteoporosis without current pathological fracture 08/21/2021   Encounter for immunization 08/21/2021   Encounter for routine adult health examination with abnormal findings 09/24/2020   Mild late onset Alzheimer's dementia (HCC) 09/24/2020   Mixed hyperlipidemia 09/24/2020   Pulmonary nodule 09/24/2020   Age related osteoporosis 09/24/2020   Allergic reaction 09/20/2019   Left leg pain 03/15/2019   Rotator cuff impingement syndrome of right shoulder 03/15/2019   History of colon polyps 04/23/2016   Bloating 04/23/2016   Lower abdominal pain 04/23/2016   Depression 11/29/2013    Past Surgical History:  Procedure Laterality Date   CATARACT EXTRACTION Bilateral     OB History   No obstetric history on file.      Home Medications    Prior to Admission medications   Medication Sig Start Date End Date Taking? Authorizing Provider  pimecrolimus (ELIDEL) 1 % cream Apply topically 2 (two) times daily. To affected area  on both arms until you see improvement and/or you see the dermatologist. 02/05/23  Yes Zenia Resides, MD  bacitracin 500 UNIT/GM ointment Apply 1 Application topically 2 (two) times daily. 01/26/23   Langley Gauss, PA  benzonatate (TESSALON) 200 MG capsule Take 1 capsule (200 mg total) by mouth 3 (three) times daily as needed for cough. 10/15/22   Renne Crigler, FNP  ciprofloxacin-dexamethasone (CIPRODEX) OTIC suspension Place 4 drops into the left ear 2 (two) times daily. 10/15/22   Renne Crigler, FNP  co-enzyme Q-10 30 MG capsule Take by mouth.    [provider]  donepezil (ARICEPT) 23 MG TABS tablet Take 23 mg by mouth at bedtime.    [provider]  doxycycline (VIBRA-TABS) 100 MG tablet Take 100 mg by mouth 2 (two) times daily. 11/14/22   [provider]  memantine (NAMENDA) 10 MG tablet Take 1 tablet by mouth 2 (two) times daily. 09/25/22   [provider]  omega-3 acid ethyl esters (LOVAZA) 1 g capsule Take 2 capsules (2 g total) by mouth 2 (two) times daily. 02/24/22   [facility]ox[facility] Mandes, MD  pravastatin (PRAVACHOL) 40 MG tablet Take 1 tablet (40 mg total) by mouth daily. 06/02/22   [facility], Fritzi Mandes, MD  triamcinolone cream (KENALOG) 0.1 % Apply 1 Application topically 2 (two) times daily. 01/20/23   Langley Gauss, PA    Family History Family History  Problem Relation Age of  Onset   Breast cancer Mother 49   CAD Father    Cancer Sister        colon    Social History Social History   Tobacco Use   Smoking status: Never   Smokeless tobacco: Never  Substance Use Topics   Alcohol use: Never   Drug use: Never     Allergies   Patient has no known allergies.   Review of Systems Review of Systems   Physical Exam Triage Vital Signs ED Triage Vitals  Encounter Vitals Group     BP 02/05/23 1639 (!) 154/69     Systolic BP Percentile --      Diastolic BP Percentile --      Pulse Rate 02/05/23 1639 69     Resp 02/05/23 1639 20     Temp 02/05/23  1639 97.8 F (36.6 C)     Temp Source 02/05/23 1639 Oral     SpO2 02/05/23 1639 98 %     Weight --      Height --      Head Circumference --      Peak Flow --      Pain Score 02/05/23 1642 5     Pain Loc --      Pain Education --      Exclude from Growth Chart --    No data found.  Updated Vital Signs BP (!) 154/69 (BP Location: Right Arm)   Pulse 69   Temp 97.8 F (36.6 C) (Oral)   Resp 20   SpO2 98%   Visual Acuity Right Eye Distance:   Left Eye Distance:   Bilateral Distance:    Right Eye Near:   Left Eye Near:    Bilateral Near:     Physical Exam Vitals reviewed.  Constitutional:      General: She is not in acute distress.    Appearance: She is not toxic-appearing.  Skin:    Coloration: Skin is not pale.     Comments: There is an erythematous rash on bilateral upper arms and forearms, lateral and extensor surface. A few excoriated spots, about 0.5 cm. No sign of secondary infection  Neurological:     Mental Status: She is alert.      UC Treatments / Results  Labs (all labs ordered are listed, but only abnormal results are displayed) Labs Reviewed - No data to display  EKG   Radiology No results found.  Procedures Procedures (including critical care time)  Medications Ordered in UC Medications - No data to display  Initial Impression / Assessment and Plan / UC Course  I have reviewed the triage vital signs and the nursing notes.  Pertinent labs & imaging results that were available during my care of the patient were reviewed by me and considered in my medical decision making (see chart for details).    We discussed changing to unscented hand soap, since she is often washing her arms with the scented stuff. Elidel sent in to see if will help, if this has an eczema component. They will see derm 2/19 Final Clinical Impressions(s) / UC Diagnoses   Final diagnoses:  Eczema, unspecified type     Discharge Instructions       Elidel/pimecrolimus cream--apply 2 times daily to the rash areas on both arms until it is improved, or when you see the dermatologist.  Try unscented liquid hand soap for handwashing.   ED Prescriptions     Medication Sig Dispense Auth. Provider  pimecrolimus (ELIDEL) 1 % cream Apply topically 2 (two) times daily. To affected area on both arms until you see improvement and/or you see the dermatologist. 100 g Zenia Resides, MD      PDMP not reviewed this encounter.   Zenia Resides, MD 02/06/23 [phone]

## 2023-02-05 NOTE — ED Triage Notes (Signed)
Rash to bilat arms x 1 month.  Per patient, right arm appears to be improved. Has been seen twice by family practice with no improvement. Has appointment in February with dermatologist.Patient states itching and burning.

## 2023-02-05 NOTE — Discharge Instructions (Signed)
Elidel/pimecrolimus cream--apply 2 times daily to the rash areas on both arms until it is improved, or when you see the dermatologist.  Try unscented liquid hand soap for handwashing.

## 2023-02-08 DIAGNOSIS — L299 Pruritus, unspecified: Secondary | ICD-10-CM | POA: Diagnosis not present

## 2023-02-08 DIAGNOSIS — L209 Atopic dermatitis, unspecified: Secondary | ICD-10-CM | POA: Diagnosis not present

## 2023-02-15 ENCOUNTER — Telehealth: Payer: Self-pay

## 2023-02-15 NOTE — Telephone Encounter (Signed)
 Copied from CRM [id number]. Topic: Clinical - Medical Advice >> Feb 15, 2023 10:01 AM Antwanette L wrote: Reason for CRM: Patient husband is calling in because his wife received a text from [employer]. Patient husband would like for Donetta Furl to call him or anyone at the office regarding his wife eczema.  Husband can be reached at  [phone].

## 2023-02-17 ENCOUNTER — Other Ambulatory Visit: Payer: Self-pay | Admitting: Family Medicine

## 2023-02-17 DIAGNOSIS — E782 Mixed hyperlipidemia: Secondary | ICD-10-CM

## 2023-02-28 NOTE — Patient Instructions (Signed)

## 2023-02-28 NOTE — Progress Notes (Unsigned)
 Subjective:   Kimberly Estrada is a 78 y.o. female who presents for Medicare Annual (Subsequent) preventive examination.  Visit Complete: {VISITMETHODVS:[phone]}  Patient Medicare AWV questionnaire was completed by the patient on ***; I have confirmed that all information answered by patient is correct and no changes since this date.        Objective:    There were no vitals filed for this visit. There is no height or weight on file to calculate BMI.     02/23/2022    8:23 AM 09/24/2020    8:14 AM  Advanced Directives  Does Patient Have a Medical Advance Directive? Yes No  Type of Estate agent of [place];Living will   Does patient want to make changes to medical advance directive? Yes (ED - Information included in AVS)   Would patient like information on creating a medical advance directive?  Yes (ED - Information included in AVS)    Current Medications (verified) Outpatient Encounter Medications as of 03/01/2023  Medication Sig   bacitracin 500 UNIT/GM ointment Apply 1 Application topically 2 (two) times daily.   benzonatate (TESSALON) 200 MG capsule Take 1 capsule (200 mg total) by mouth 3 (three) times daily as needed for cough.   ciprofloxacin-dexamethasone (CIPRODEX) OTIC suspension Place 4 drops into the left ear 2 (two) times daily.   co-enzyme Q-10 30 MG capsule Take by mouth.   donepezil (ARICEPT) 23 MG TABS tablet Take 23 mg by mouth at bedtime.   doxycycline (VIBRA-TABS) 100 MG tablet Take 100 mg by mouth 2 (two) times daily.   memantine (NAMENDA) 10 MG tablet Take 1 tablet by mouth 2 (two) times daily.   omega-3 acid ethyl esters (LOVAZA) 1 g capsule Take 2 capsules (2 g total) by mouth 2 (two) times daily.   pimecrolimus (ELIDEL) 1 % cream Apply topically 2 (two) times daily. To affected area on both arms until you see improvement and/or you see the dermatologist.   pravastatin (PRAVACHOL) 40 MG tablet TAKE 1 TABLET BY MOUTH EVERY DAY    triamcinolone cream (KENALOG) 0.1 % Apply 1 Application topically 2 (two) times daily.   No facility-administered encounter medications on file as of 03/01/2023.    Allergies (verified) Patient has no known allergies.   History: Past Medical History:  Diagnosis Date   GAD (generalized anxiety disorder)    Mixed hyperlipidemia    Nummular dermatitis    Osteoporosis    Palpitations    Past Surgical History:  Procedure Laterality Date   CATARACT EXTRACTION Bilateral    Family History  Problem Relation Age of Onset   Breast cancer Mother 59   CAD Father    Cancer Sister        colon   Social History   Socioeconomic History   Marital status: Married    Spouse name: Melenda Bielak   Number of children: 3   Years of education: Not on file   Highest education level: Not on file  Occupational History   Not on file  Tobacco Use   Smoking status: Never   Smokeless tobacco: Never  Substance and Sexual Activity   Alcohol use: Never   Drug use: Never   Sexual activity: Yes    Partners: Female  Other Topics Concern   Not on file  Social History Narrative   Not on file   Social Drivers of Health   Financial Resource Strain: Low Risk  (08/21/2021)   Overall Financial Resource Strain (CARDIA)  Difficulty of Paying Living Expenses: Not hard at all  Food Insecurity: No Food Insecurity (08/21/2021)   Hunger Vital Sign    Worried About Running Out of Food in the Last Year: Never true    Ran Out of Food in the Last Year: Never true  Transportation Needs: No Transportation Needs (08/21/2021)   PRAPARE - Administrator, Civil Service (Medical): No    Lack of Transportation (Non-Medical): No  Physical Activity: Insufficiently Active (08/21/2021)   Exercise Vital Sign    Days of Exercise per Week: 3 days    Minutes of Exercise per Session: 30 min  Stress: No Stress Concern Present (08/21/2021)   [employer] of Occupational Health - Occupational Stress Questionnaire     Feeling of Stress : Not at all  Social Connections: Moderately Integrated (02/23/2022)   Social Connection and Isolation Panel [NHANES]    Frequency of Communication with Friends and Family: Three times a week    Frequency of Social Gatherings with Friends and Family: Three times a week    Attends Religious Services: More than 4 times per year    Active Member of Clubs or Organizations: No    Attends Banker Meetings: Never    Marital Status: Married    Tobacco Counseling Counseling given: Not Answered   Clinical Intake:                        Activities of Daily Living     No data to display           Patient Care Team: Blane Ohara, MD as PCP - General (Family Medicine)  Indicate any recent Medical Services you may have received from other than [facility] providers in the past year (date may be approximate).     Assessment:   This is a routine wellness examination for Kimberly Estrada.  Hearing/Vision screen No results found.   Goals Addressed   None   Depression Screen    02/23/2022    8:22 AM 08/21/2021    7:56 AM 09/24/2020    8:14 AM  PHQ 2/9 Scores  PHQ - 2 Score 0 0 0    Fall Risk    02/23/2022    8:23 AM 08/21/2021    7:56 AM 09/24/2020    8:14 AM  Fall Risk   Falls in the past year? 1 0 0  Comment 11/2021    Number falls in past yr: 0 0 0  Injury with Fall? 1 0 0  Risk for fall due to : No Fall Risks No Fall Risks No Fall Risks  Follow up Falls evaluation completed Falls evaluation completed Falls evaluation completed    MEDICARE RISK AT HOME:    TIMED UP AND GO:  Was the test performed?  {[phone]}    Cognitive Function:    02/23/2022    8:43 AM 11/12/2020   10:35 AM  MMSE - Mini Mental State Exam  Not completed: Refused   Orientation to time  3  Orientation to Place  3  Registration  3  Attention/ Calculation  4  Recall  0  Language- name 2 objects  2  Language- repeat  1  Language- follow 3 step  command  3  Language- read & follow direction  1  Write a sentence  1  Copy design  1  Total score  22        09/24/2020    9:01 AM  6CIT  Screen  What Year? 4 points  What month? 0 points  What time? 3 points  Count back from 20 0 points  Months in reverse 0 points  Repeat phrase 10 points  Total Score 17 points    Immunizations Immunization History  Administered Date(s) Administered   Fluad Quad(high Dose 65+) 10/26/2019, 09/24/2020   Fluad Trivalent(High Dose 65+) 10/15/2022   Influenza-Unspecified 09/23/2018   Moderna Covid-19 Vaccine Bivalent Booster 100yrs & up 02/18/2021   Moderna Sars-Covid-2 Vaccination 02/03/2019, 03/01/2019, 11/29/2019   Pneumococcal Conjugate-13 06/12/2015   Pneumococcal Polysaccharide-23 09/07/2017    {TDAP status:2101805}  {Flu Vaccine status:2101806}  {Pneumococcal vaccine status:2101807}  {Covid-19 vaccine status:2101808}  Qualifies for Shingles Vaccine? {YES/NO:21197}  Zostavax completed {YES/NO:21197}  {Shingrix Completed?:2101804}  Screening Tests Health Maintenance  Topic Date Due   Hepatitis C Screening  Never done   DTaP/Tdap/Td (1 - Tdap) Never done   Zoster Vaccines- Shingrix (1 of 2) Never done   COVID-19 Vaccine (5 - 2024-25 season) 09/06/2022   Medicare Annual Wellness (AWV)  02/29/2024   Colonoscopy  12/02/2026   Pneumonia Vaccine 52+ Years old  Completed   INFLUENZA VACCINE  Completed   DEXA SCAN  Completed   HPV VACCINES  Aged Out    Health Maintenance  Health Maintenance Due  Topic Date Due   Hepatitis C Screening  Never done   DTaP/Tdap/Td (1 - Tdap) Never done   Zoster Vaccines- Shingrix (1 of 2) Never done   COVID-19 Vaccine (5 - 2024-25 season) 09/06/2022    {Colorectal cancer screening:2101809}  {Mammogram status:21018020}  {Bone Density status:21018021}  Lung Cancer Screening: (Low Dose CT Chest recommended if Age 65-80 years, 20 pack-year currently smoking OR have quit w/in 15years.) {DOES  NOT does:27190::"does not"} qualify.   Lung Cancer Screening Referral: ***  Additional Screening:  Hepatitis C Screening: {DOES NOT does:27190::"does not"} qualify; Completed ***  Vision Screening: Recommended annual ophthalmology exams for early detection of glaucoma and other disorders of the eye. Is the patient up to date with their annual eye exam?  {YES/NO:21197} Who is the provider or what is the name of the office in which the patient attends annual eye exams? *** If pt is not established with a provider, would they like to be referred to a provider to establish care? {YES/NO:21197}.   Dental Screening: Recommended annual dental exams for proper oral hygiene  Diabetic Foot Exam: {Diabetic Foot Exam:2101802}  Community Resource Referral / Chronic Care Management: CRR required this visit?  {YES/NO:21197}  CCM required this visit?  {CCM Required choices:[phone]}     Plan:     I have personally reviewed and noted the following in the patient's chart:   Medical and social history Use of alcohol, tobacco or illicit drugs  Current medications and supplements including opioid prescriptions. {Opioid Prescriptions:[phone]} Functional ability and status Nutritional status Physical activity Advanced directives List of other physicians Hospitalizations, surgeries, and ER visits in previous 12 months Vitals Screenings to include cognitive, depression, and falls Referrals and appointments  In addition, I have reviewed and discussed with patient certain preventive protocols, quality metrics, and best practice recommendations. A written personalized care plan for preventive services as well as general preventive health recommendations were provided to patient.     Eugenie Norrie, CMA   02/28/2023   After Visit Summary: {CHL AMB AWV After Visit Summary:[phone]}  Nurse Notes: ***

## 2023-03-01 ENCOUNTER — Ambulatory Visit (INDEPENDENT_AMBULATORY_CARE_PROVIDER_SITE_OTHER): Admitting: Family Medicine

## 2023-03-01 ENCOUNTER — Encounter: Payer: Self-pay | Admitting: Family Medicine

## 2023-03-01 VITALS — BP 122/64 | HR 83 | Temp 97.5°F | Ht 61.5 in | Wt 124.0 lb

## 2023-03-01 DIAGNOSIS — G301 Alzheimer's disease with late onset: Secondary | ICD-10-CM

## 2023-03-01 DIAGNOSIS — Z0000 Encounter for general adult medical examination without abnormal findings: Secondary | ICD-10-CM

## 2023-03-01 DIAGNOSIS — M810 Age-related osteoporosis without current pathological fracture: Secondary | ICD-10-CM

## 2023-03-01 DIAGNOSIS — E782 Mixed hyperlipidemia: Secondary | ICD-10-CM

## 2023-03-01 DIAGNOSIS — F02A18 Dementia in other diseases classified elsewhere, mild, with other behavioral disturbance: Secondary | ICD-10-CM | POA: Diagnosis not present

## 2023-03-01 NOTE — Assessment & Plan Note (Signed)
 Not at goal. Would not aggressively treat.  Patient refuses labs.  Continue pravastatin.  If wishes to stop pravastatin, I would be agreeable as her dementia is progressing and unsure if patient is benefiting from pravastatin.

## 2023-03-01 NOTE — Assessment & Plan Note (Signed)
 Refuses treatment and dexa  Recommend calcium citrate with D 600 mg twice daily.

## 2023-03-01 NOTE — Assessment & Plan Note (Signed)
 Patient frequently is irritable.  Continue namenda and aricept.

## 2023-03-01 NOTE — Assessment & Plan Note (Signed)
 Patient can be very frustrating.  Patient has a terminal illness so I do not recommend preventative treatment.

## 2023-04-20 ENCOUNTER — Other Ambulatory Visit: Payer: Self-pay | Admitting: Family Medicine

## 2023-04-20 DIAGNOSIS — Z1231 Encounter for screening mammogram for malignant neoplasm of breast: Secondary | ICD-10-CM

## 2023-04-21 ENCOUNTER — Ambulatory Visit (HOSPITAL_BASED_OUTPATIENT_CLINIC_OR_DEPARTMENT_OTHER)
Admission: EM | Admit: 2023-04-21 | Discharge: 2023-04-21 | Disposition: A | Discharge status: Home / Self Care | Attending: Nurse Practitioner | Admitting: Nurse Practitioner

## 2023-04-21 ENCOUNTER — Encounter (HOSPITAL_BASED_OUTPATIENT_CLINIC_OR_DEPARTMENT_OTHER): Payer: Self-pay

## 2023-04-21 DIAGNOSIS — S0501XA Injury of conjunctiva and corneal abrasion without foreign body, right eye, initial encounter: Secondary | ICD-10-CM

## 2023-04-21 MED ORDER — ERYTHROMYCIN 5 MG/GM OP OINT: TOPICAL_OINTMENT | Freq: Four times a day (QID) | OPHTHALMIC | 0 refills | Status: AC

## 2023-04-21 NOTE — ED Provider Notes (Signed)
 [facility]    CSN: [id number] Arrival date & time: 04/21/23  1244      History   Chief Complaint Chief Complaint  Patient presents with   Eye Pain    HPI Kimberly Estrada is a 78 y.o. female.   Patient presents for evaluation of a right eye injury that happened yesterday.  She was outside sweeping and the wind-something flew up and got caught in her right eye.  Today, she has developed eye redness, discharge, and generalized swelling.  Due to her dementia, she is not able to truly specify how much discomfort she is in or the extent of blurred vision if it is present.  No other injuries or concerns reported elsewhere.  She is here with her husband.  The history is provided by the patient and the spouse.  Eye Pain Pertinent negatives include no chest pain, no abdominal pain, no headaches and no shortness of breath.    Past Medical History:  Diagnosis Date   GAD (generalized anxiety disorder)    Mixed hyperlipidemia    Nummular dermatitis    Osteoporosis    Palpitations     Patient Active Problem List   Diagnosis Date Noted   Flexural atopic dermatitis 01/22/2023   Acute cough 10/15/2022   Otitis externa of left ear 10/15/2022   SAH (subarachnoid hemorrhage) (HCC) 11/19/2021   Syncope 11/19/2021   Osteoporosis without current pathological fracture 08/21/2021   Encounter for Medicare annual wellness exam 08/21/2021   Encounter for routine adult health examination with abnormal findings 09/24/2020   Mild late onset Alzheimer's dementia (HCC) 09/24/2020   Mixed hyperlipidemia 09/24/2020   Pulmonary nodule 09/24/2020   Age related osteoporosis 09/24/2020   Allergic reaction 09/20/2019   Left leg pain 03/15/2019   Rotator cuff impingement syndrome of right shoulder 03/15/2019   History of colon polyps 04/23/2016   Bloating 04/23/2016   Lower abdominal pain 04/23/2016   Depression 11/29/2013    Past Surgical History:  Procedure Laterality Date   CATARACT  EXTRACTION Bilateral     OB History   No obstetric history on file.      Home Medications    Prior to Admission medications   Medication Sig Start Date End Date Taking? Authorizing Provider  erythromycin ophthalmic ointment Place into the right eye 4 (four) times daily for 7 days. Place a 1/2 inch ribbon of ointment into the lower eyelid. 04/21/23 04/28/23 Yes Genene Kennel, FNP  co-enzyme Q-10 30 MG capsule Take by mouth.    [provider]  donepezil (ARICEPT) 23 MG TABS tablet Take 23 mg by mouth at bedtime.    [provider]  memantine (NAMENDA) 10 MG tablet Take 1 tablet by mouth 2 (two) times daily. 09/25/22   [provider]  pravastatin (PRAVACHOL) 40 MG tablet TAKE 1 TABLET BY MOUTH EVERY DAY 02/17/23   CoxBurleigh Carp, MD    Family History Family History  Problem Relation Age of Onset   Breast cancer Mother 72   CAD Father    Cancer Sister        colon    Social History Social History   Tobacco Use   Smoking status: Never   Smokeless tobacco: Never  Substance Use Topics   Alcohol use: Never   Drug use: Never     Allergies   Patient has no known allergies.   Review of Systems Review of Systems  Constitutional:  Negative for fatigue and fever.  HENT:  Negative  for congestion, rhinorrhea and sore throat.   Eyes:  Positive for pain, discharge and redness.  Respiratory:  Negative for cough and shortness of breath.   Cardiovascular:  Negative for chest pain and palpitations.  Gastrointestinal:  Negative for abdominal pain, diarrhea, nausea and vomiting.  Genitourinary:  Negative for dysuria.  Musculoskeletal:  Negative for arthralgias and myalgias.  Skin:  Negative for rash.  Neurological:  Negative for dizziness and headaches.     Physical Exam Triage Vital Signs ED Triage Vitals  Encounter Vitals Group     BP 04/21/23 1309 (!) 145/78     Systolic BP Percentile --      Diastolic BP Percentile --      Pulse Rate 04/21/23 1309  68     Resp 04/21/23 1309 20     Temp 04/21/23 1309 98.3 F (36.8 C)     Temp Source 04/21/23 1309 Oral     SpO2 04/21/23 1309 97 %     Weight --      Height --      Head Circumference --      Peak Flow --      Pain Score 04/21/23 1311 4     Pain Loc --      Pain Education --      Exclude from Growth Chart --    No data found.  Updated Vital Signs BP (!) 145/78 (BP Location: Right Arm)   Pulse 68   Temp 98.3 F (36.8 C) (Oral)   Resp 20   SpO2 97%   Visual Acuity Right Eye Distance: 20/50 Left Eye Distance: 20/50 Bilateral Distance: 20/50 (uncorrected)  Right Eye Near:   Left Eye Near:    Bilateral Near:     Physical Exam Vitals and nursing note reviewed.  Constitutional:      Appearance: Normal appearance.  HENT:     Head: Normocephalic.  Eyes:     General: Lids are everted, no foreign bodies appreciated.        Right eye: Discharge present. No foreign body.        Left eye: No foreign body or discharge.     Conjunctiva/sclera:     Right eye: Right conjunctiva is injected. Exudate present.     Left eye: Left conjunctiva is not injected. No exudate.    Comments: Tetracaine 0.5% ophthalmic solution instilled to the right eye.  There was positive fluorescein uptake to the right lateral aspect of the eye.  No foreign body was observed.  Upper/lower eyelids were everted for examination as well.  Cardiovascular:     Rate and Rhythm: Normal rate and regular rhythm.     Heart sounds: Normal heart sounds.  Pulmonary:     Effort: Pulmonary effort is normal.     Breath sounds: Normal breath sounds.  Neurological:     General: No focal deficit present.     Mental Status: She is alert. Mental status is at baseline.  Psychiatric:        Mood and Affect: Mood normal.        Behavior: Behavior normal.      UC Treatments / Results  Labs (all labs ordered are listed, but only abnormal results are displayed) Labs Reviewed - No data to display  EKG   Radiology No  results found.  Procedures Procedures (including critical care time)  Medications Ordered in UC Medications - No data to display  Initial Impression / Assessment and Plan / UC Course  I have  reviewed the triage vital signs and the nursing notes.  Pertinent labs & imaging results that were available during my care of the patient were reviewed by me and considered in my medical decision making (see chart for details).    Patient presents for evaluation of her right eye after something flew up into it while she was sweeping in the wind.  There is diffuse erythema, injection, and scant discharge noted to the eye.  Mild swelling to the upper and lower eyelid.  Fluorescein uptake was noted to the lateral aspect of the eye.  No foreign bodies visualized.  Will plan treatment with erythromycin ophthalmic ointment and follow-up with eye doctor should symptoms persist or worsen.  Encouraged cool compresses for the inflammation. Final Clinical Impressions(s) / UC Diagnoses   Final diagnoses:  Abrasion of right cornea, initial encounter     Discharge Instructions      It appears that you have a scratch to your right eye.  Please use the antibiotic ointment 4 times daily for 7 days.  If the redness, discharge, or swelling worsens-or your vision becomes blurry or the pain increases, please follow-up with your eye doctor.    ED Prescriptions     Medication Sig Dispense Auth. Provider   erythromycin ophthalmic ointment Place into the right eye 4 (four) times daily for 7 days. Place a 1/2 inch ribbon of ointment into the lower eyelid. 3.5 g Genene Kennel, FNP      PDMP not reviewed this encounter.   Genene Kennel, FNP 04/21/23 1410

## 2023-04-21 NOTE — ED Triage Notes (Signed)
 Sweeping porch yesterday and wind blowing dirt around. Believes may have gotten dirt in eye and scratched right eye. +Redness, swelling visible.

## 2023-04-21 NOTE — Discharge Instructions (Addendum)
 It appears that you have a scratch to your right eye.  Please use the antibiotic ointment 4 times daily for 7 days.  If the redness, discharge, or swelling worsens-or your vision becomes blurry or the pain increases, please follow-up with your eye doctor.

## 2023-05-24 ENCOUNTER — Ambulatory Visit

## 2023-06-01 ENCOUNTER — Ambulatory Visit
Admission: RE | Admit: 2023-06-01 | Discharge: 2023-06-01 | Disposition: A | Discharge status: Home / Self Care | Source: Ambulatory Visit | Attending: Family Medicine | Admitting: Family Medicine

## 2023-06-01 DIAGNOSIS — Z1231 Encounter for screening mammogram for malignant neoplasm of breast: Secondary | ICD-10-CM

## 2023-06-03 ENCOUNTER — Ambulatory Visit: Payer: Self-pay | Admitting: Family Medicine

## 2023-06-21 DIAGNOSIS — Z6823 Body mass index (BMI) 23.0-23.9, adult: Secondary | ICD-10-CM | POA: Diagnosis not present

## 2023-06-21 DIAGNOSIS — F0390 Unspecified dementia without behavioral disturbance: Secondary | ICD-10-CM | POA: Diagnosis not present

## 2023-06-21 DIAGNOSIS — H9193 Unspecified hearing loss, bilateral: Secondary | ICD-10-CM | POA: Diagnosis not present

## 2023-06-21 DIAGNOSIS — E785 Hyperlipidemia, unspecified: Secondary | ICD-10-CM | POA: Diagnosis not present

## 2023-06-21 DIAGNOSIS — Z789 Other specified health status: Secondary | ICD-10-CM | POA: Diagnosis not present

## 2023-06-28 DIAGNOSIS — Z6824 Body mass index (BMI) 24.0-24.9, adult: Secondary | ICD-10-CM | POA: Diagnosis not present

## 2023-06-28 DIAGNOSIS — F0390 Unspecified dementia without behavioral disturbance: Secondary | ICD-10-CM | POA: Diagnosis not present

## 2023-06-28 DIAGNOSIS — E785 Hyperlipidemia, unspecified: Secondary | ICD-10-CM | POA: Diagnosis not present

## 2023-07-28 DIAGNOSIS — Z6823 Body mass index (BMI) 23.0-23.9, adult: Secondary | ICD-10-CM | POA: Diagnosis not present

## 2023-07-28 DIAGNOSIS — M25561 Pain in right knee: Secondary | ICD-10-CM | POA: Diagnosis not present

## 2023-08-16 DIAGNOSIS — M25561 Pain in right knee: Secondary | ICD-10-CM | POA: Diagnosis not present

## 2023-08-25 DIAGNOSIS — M25561 Pain in right knee: Secondary | ICD-10-CM | POA: Diagnosis not present

## 2023-08-30 DIAGNOSIS — M25461 Effusion, right knee: Secondary | ICD-10-CM | POA: Diagnosis not present

## 2023-08-30 DIAGNOSIS — M7989 Other specified soft tissue disorders: Secondary | ICD-10-CM | POA: Diagnosis not present

## 2023-08-30 DIAGNOSIS — M25561 Pain in right knee: Secondary | ICD-10-CM | POA: Diagnosis not present

## 2023-09-08 DIAGNOSIS — M25561 Pain in right knee: Secondary | ICD-10-CM | POA: Diagnosis not present

## 2023-09-08 DIAGNOSIS — M84351A Stress fracture, right femur, initial encounter for fracture: Secondary | ICD-10-CM | POA: Diagnosis not present

## 2023-09-24 DIAGNOSIS — R0981 Nasal congestion: Secondary | ICD-10-CM | POA: Diagnosis not present

## 2023-09-24 DIAGNOSIS — R0982 Postnasal drip: Secondary | ICD-10-CM | POA: Diagnosis not present

## 2023-09-24 DIAGNOSIS — R051 Acute cough: Secondary | ICD-10-CM | POA: Diagnosis not present

## 2023-10-22 DIAGNOSIS — Z23 Encounter for immunization: Secondary | ICD-10-CM | POA: Diagnosis not present

## 2023-12-08 DIAGNOSIS — Z6823 Body mass index (BMI) 23.0-23.9, adult: Secondary | ICD-10-CM | POA: Diagnosis not present

## 2023-12-08 DIAGNOSIS — F0390 Unspecified dementia without behavioral disturbance: Secondary | ICD-10-CM | POA: Diagnosis not present

## 2023-12-08 DIAGNOSIS — M545 Low back pain, unspecified: Secondary | ICD-10-CM | POA: Diagnosis not present

## 2023-12-08 DIAGNOSIS — R410 Disorientation, unspecified: Secondary | ICD-10-CM | POA: Diagnosis not present

## 2023-12-22 DIAGNOSIS — Z111 Encounter for screening for respiratory tuberculosis: Secondary | ICD-10-CM | POA: Diagnosis not present

## 2024-01-09 ENCOUNTER — Other Ambulatory Visit: Payer: Self-pay

## 2024-01-09 ENCOUNTER — Emergency Department (HOSPITAL_COMMUNITY)

## 2024-01-09 ENCOUNTER — Emergency Department (HOSPITAL_COMMUNITY)
Admission: EM | Admit: 2024-01-09 | Discharge: 2024-01-09 | Disposition: A | Discharge status: Home / Self Care | Attending: Emergency Medicine | Admitting: Emergency Medicine

## 2024-01-09 ENCOUNTER — Encounter (HOSPITAL_COMMUNITY): Payer: Self-pay

## 2024-01-09 DIAGNOSIS — R569 Unspecified convulsions: Secondary | ICD-10-CM | POA: Insufficient documentation

## 2024-01-09 DIAGNOSIS — F0390 Unspecified dementia without behavioral disturbance: Secondary | ICD-10-CM | POA: Diagnosis not present

## 2024-01-09 LAB — COMPREHENSIVE METABOLIC PANEL WITH GFR
ALT: 38 U/L (ref 0–44)
AST: 36 U/L (ref 15–41)
Albumin: 4.2 g/dL (ref 3.5–5.0)
Alkaline Phosphatase: 94 U/L (ref 38–126)
Anion gap: 12 (ref 5–15)
BUN: 21 mg/dL (ref 8–23)
CO2: 25 mmol/L (ref 22–32)
Calcium: 9.4 mg/dL (ref 8.9–10.3)
Chloride: 104 mmol/L (ref 98–111)
Creatinine, Ser: 0.83 mg/dL (ref 0.44–1.00)
GFR, Estimated: >60 mL/min
Glucose, Bld: 194 mg/dL — ABNORMAL HIGH (ref 70–99)
Potassium: 4 mmol/L (ref 3.5–5.1)
Sodium: 141 mmol/L (ref 135–145)
Total Bilirubin: 0.6 mg/dL (ref 0.0–1.2)
Total Protein: 6.8 g/dL (ref 6.5–8.1)

## 2024-01-09 LAB — URINALYSIS, ROUTINE W REFLEX MICROSCOPIC
Bilirubin Urine: NEGATIVE
Glucose, UA: NEGATIVE mg/dL
Hgb urine dipstick: NEGATIVE
Ketones, ur: NEGATIVE mg/dL
Leukocytes,Ua: NEGATIVE
Nitrite: NEGATIVE
Protein, ur: NEGATIVE mg/dL
Specific Gravity, Urine: 1.025 (ref 1.005–1.030)
pH: 5 (ref 5.0–8.0)

## 2024-01-09 LAB — CBC WITH DIFFERENTIAL/PLATELET
Abs Immature Granulocytes: 0.05 K/uL (ref 0.00–0.07)
Basophils Absolute: 0 K/uL (ref 0.0–0.1)
Basophils Relative: 0 %
Eosinophils Absolute: 0.1 K/uL (ref 0.0–0.5)
Eosinophils Relative: 1 %
HCT: 43.7 % (ref 36.0–46.0)
Hemoglobin: 14.8 g/dL (ref 12.0–15.0)
Immature Granulocytes: 0 %
Lymphocytes Relative: 3 %
Lymphs Abs: 0.4 K/uL — ABNORMAL LOW (ref 0.7–4.0)
MCH: 29.8 pg (ref 26.0–34.0)
MCHC: 33.9 g/dL (ref 30.0–36.0)
MCV: 87.9 fL (ref 80.0–100.0)
Monocytes Absolute: 1.1 K/uL — ABNORMAL HIGH (ref 0.1–1.0)
Monocytes Relative: 9 %
Neutro Abs: 10.3 K/uL — ABNORMAL HIGH (ref 1.7–7.7)
Neutrophils Relative %: 87 %
Platelets: 224 K/uL (ref 150–400)
RBC: 4.97 MIL/uL (ref 3.87–5.11)
RDW: 12.9 % (ref 11.5–15.5)
WBC: 12 K/uL — ABNORMAL HIGH (ref 4.0–10.5)
nRBC: 0 % (ref 0.0–0.2)

## 2024-01-09 LAB — ETHANOL: Alcohol, Ethyl (B): <15 mg/dL

## 2024-01-09 LAB — MAGNESIUM: Magnesium: 1.8 mg/dL (ref 1.7–2.4)

## 2024-01-09 LAB — CBG MONITORING, ED: Glucose-Capillary: 164 mg/dL — ABNORMAL HIGH (ref 70–99)

## 2024-01-09 MED ORDER — LEVETIRACETAM 500 MG PO TABS: 500.0000 mg | ORAL_TABLET | Freq: Two times a day (BID) | ORAL | 0 refills | Status: AC

## 2024-01-09 MED ORDER — SODIUM CHLORIDE 0.9 % IV BOLUS
500.0000 mL | Freq: Once | INTRAVENOUS | Status: AC
  Administered 2024-01-09: 500 mL via INTRAVENOUS

## 2024-01-09 MED ORDER — LACTATED RINGERS IV BOLUS: 500.0000 mL | Freq: Once | INTRAVENOUS | Status: DC

## 2024-01-09 MED ORDER — LEVETIRACETAM 500 MG PO TABS: 500.0000 mg | ORAL_TABLET | Freq: Two times a day (BID) | ORAL | 0 refills | Status: DC

## 2024-01-09 MED ORDER — LEVETIRACETAM (KEPPRA) 500 MG/5 ML ADULT IV PUSH
1000.0000 mg | Freq: Once | INTRAVENOUS | Status: AC
  Administered 2024-01-09: 1000 mg via INTRAVENOUS
  Filled 2024-01-09: qty 10

## 2024-01-09 NOTE — ED Triage Notes (Signed)
 Pt arrives from [facility] for unresponsiveness and syncope for a few seconds per facility report pt turned pale while sitting on toilet and the L sided neck vein popped out En route to ED, pt started gazing to R with arms twitching but not shaking lasting 10-15sec and spitting up immediately prior, HR 134 128 BP 92% RA during seizure like activity CBG 183 Pt became more active as ride went on, possibly post icta but started talking immediately after the twitching   At baseline mentation H/o dementia and DNR  90s HR CB 183 BP 128/palp

## 2024-01-09 NOTE — ED Notes (Signed)
 Transporter brought pt back from CT and reports that during CT the pt needed to be sternal rubbed because she seemed like she was going out and then started spitting up. Upon arrival to pt room, HR 130s and pt was gazing to the L side only for a moment before her heart rate returned back to 70s and she was awake and talking. MD rees made aware of incident. VSS, pt awake and talking.

## 2024-01-09 NOTE — ED Notes (Signed)
 Removed brief d/t BM. Pt is now clean. Husband at bedside.

## 2024-01-09 NOTE — Discharge Instructions (Addendum)
 Please follow-up with your neurologist.  Return for fevers, chills, passout, severe headache, facial droop, unilateral weakness, recurrent seizures or any new or worsening symptoms that are concerning to you.

## 2024-01-09 NOTE — ED Notes (Signed)
 [facility] called.

## 2024-01-09 NOTE — ED Provider Notes (Signed)
 " [place] [facility] AT [facility] Provider Note   CSN: [id number] Arrival date & time: 01/09/24  0327     Patient presents with: Seizures   Kimberly Estrada is a 79 y.o. female.   The history is provided by the patient, the EMS personnel and medical records.  Kimberly Estrada is a 78 y.o. female who presents to the Emergency Department complaining of seizure.  She presents to the emergency department by EMS from nursing facility for evaluation of possible seizure-like activity.  Per EMS she was sitting on the commode when she briefly became unresponsive.  For EMS while she was getting transported to the hospital she had a 10 to 15-second episode where she had a gaze deviation to the right.  She may have had some low amplitude movement of her arms.  Unclear if there is a postictal period.  Patient denies any complaints.  She does have a history of dementia.     Prior to Admission medications  Medication Sig Start Date End Date Taking? Authorizing Provider  co-enzyme Q-10 30 MG capsule Take by mouth.    [provider]  donepezil  (ARICEPT ) 23 MG TABS tablet Take 23 mg by mouth at bedtime.    [provider]  memantine  (NAMENDA ) 10 MG tablet Take 1 tablet by mouth 2 (two) times daily. 09/25/22   [provider]  pravastatin  (PRAVACHOL ) 40 MG tablet TAKE 1 TABLET BY MOUTH EVERY DAY 02/17/23   Sherre Clapper, MD    Allergies: Patient has no known allergies.    Review of Systems  All other systems reviewed and are negative.   Updated Vital Signs BP (!) 142/89   Pulse 70   Temp 99.1 F (37.3 C) (Axillary)   Resp 18   SpO2 95%   Physical Exam Vitals and nursing note reviewed.  Constitutional:      Appearance: She is well-developed.  HENT:     Head: Normocephalic and atraumatic.  Cardiovascular:     Rate and Rhythm: Normal rate and regular rhythm.     Heart sounds: No murmur heard. Pulmonary:     Effort: Pulmonary effort is normal. No  respiratory distress.     Breath sounds: Normal breath sounds.  Abdominal:     Palpations: Abdomen is soft.     Tenderness: There is no abdominal tenderness. There is no guarding or rebound.  Musculoskeletal:        General: No tenderness.  Skin:    General: Skin is warm and dry.  Neurological:     Mental Status: She is alert.     Comments: Very confused.  Disoriented to person, place and recent events.  She does not follow commands but she does move all extremities symmetrically  Psychiatric:        Behavior: Behavior normal.     (all labs ordered are listed, but only abnormal results are displayed) Labs Reviewed  COMPREHENSIVE METABOLIC PANEL WITH GFR - Abnormal; Notable for the following components:      Result Value   Glucose, Bld 194 (*)    All other components within normal limits  CBC WITH DIFFERENTIAL/PLATELET - Abnormal; Notable for the following components:   WBC 12.0 (*)    Neutro Abs 10.3 (*)    Lymphs Abs 0.4 (*)    Monocytes Absolute 1.1 (*)    All other components within normal limits  CBG MONITORING, ED - Abnormal; Notable for the following components:   Glucose-Capillary 164 (*)  All other components within normal limits  ETHANOL  MAGNESIUM  URINALYSIS, ROUTINE W REFLEX MICROSCOPIC    EKG: EKG Interpretation Date/Time:  Sunday January 09 2024 03:39:20 EST Ventricular Rate:  76 PR Interval:  138 QRS Duration:  72 QT Interval:  378 QTC Calculation: 425 R Axis:   45  Text Interpretation: Sinus rhythm Probable anteroseptal infarct, old Minimal ST depression, diffuse leads Confirmed by Griselda Norris [phone]) on 01/09/2024 4:25:47 AM  Radiology: [facility] Chest Port 1 View Result Date: 01/09/2024 EXAM: 1 VIEW(S) XRAY OF THE CHEST 01/09/2024 04:07:04 AM COMPARISON: None available. CLINICAL HISTORY: syncope FINDINGS: LUNGS AND PLEURA: No focal pulmonary opacity. No pleural effusion. No pneumothorax. HEART AND MEDIASTINUM: No acute abnormality of the cardiac and  mediastinal silhouettes. VASCULATURE: Atherosclerotic plaque. BONES AND SOFT TISSUES: No acute osseous abnormality. IMPRESSION: 1. No acute cardiopulmonary findings. 2. Aortic atherosclerosis. Electronically signed by: Evalene Coho MD 01/09/2024 04:46 AM EST RP Workstation: [id number]   CT Head Wo Contrast Result Date: 01/09/2024 EXAM: CT HEAD WITHOUT CONTRAST 01/09/2024 04:01:38 AM TECHNIQUE: CT of the head was performed without the administration of intravenous contrast. Automated exposure control, iterative reconstruction, and/or weight based adjustment of the mA/kV was utilized to reduce the radiation dose to as low as reasonably achievable. COMPARISON: None available. CLINICAL HISTORY: Seizure, new-onset, no history of trauma. FINDINGS: BRAIN AND VENTRICLES: No acute hemorrhage. No evidence of acute infarct. No hydrocephalus. No extra-axial collection. No mass effect or midline shift. Generalized volume loss without lobar predominance. ORBITS: Bilateral lens replacement noted. SINUSES: Mucosal thickening in right maxillary sinus with osseous changes suggesting chronic right maxillary sinusitis. SOFT TISSUES AND SKULL: No acute soft tissue abnormality. No skull fracture. IMPRESSION: 1. No acute intracranial abnormality. 2. Generalized volume loss without lobar predominance. 3. Chronic right maxillary sinusitis. Electronically signed by: Evalene Coho MD 01/09/2024 04:08 AM EST RP Workstation: [id number]     Procedures   Medications Ordered in the ED  levETIRAcetam  (KEPPRA ) undiluted injection 1,000 mg (1,000 mg Intravenous Given 01/09/24 0509)                                    Medical Decision Making Amount and/or Complexity of Data Reviewed Labs: ordered. Radiology: ordered.   Patient with history of dementia here for evaluation following a seizure-like activity witnessed by EMS and nursing facility.  Patient's dementia does limit examination as she is noncompliant with most  neurologic examination.  Husband states that she is at her baseline.  During her ED stay she did have a recurrent episode of seizure-like activity while in CT scan.  Discussed the patient with Dr.Khaliqdina with neurology, who recommends starting Keppra  load and observing to ensure no recurrent seizures for the next several hours.  If she remains seizure-free she may be discharged on Keppra  500 twice daily.  Patient care transferred pending urinalysis, observation.     Final diagnoses:  Seizure [facility])    ED Discharge Orders     None          Griselda Norris, MD 01/09/24 0725  "

## 2024-01-09 NOTE — ED Provider Notes (Signed)
 Clinical Course as of 01/09/24 1204  Sun Jan 09, 2024  9258 Received signout.  See prior team's note for full HPI.  Signed out pending a UA and observation.  I did evaluate patient and currently she is resting comfortably.  Family at bedside and has not witnessed any further seizure activity.  Straight cath ordered to facilitate urinalysis. [TY]  1204 UA finally obtained.  No evidence of infection.  Family notes that patient is sleepy, but when she awake she is at her baseline.  No significant abnormalities on workup.  Will discharge as planned. [TY]    Clinical Course User Index [TY] Neysa Caron PARAS, DO      Neysa Caron PARAS, [place] 01/09/24 1205

## 2024-01-09 NOTE — ED Notes (Signed)
 Pt family transported pt back to facility. Spoke to [place] at [facility] gave report and will be waiting for pt to arrive.

## 2024-03-01 ENCOUNTER — Ambulatory Visit: Payer: Medicare HMO | Admitting: Family Medicine
